# Patient Record
Sex: Female | Born: 1945 | Race: White | Hispanic: No | Marital: Married | Smoking: Never smoker
Health system: Southern US, Community
[De-identification: ages and names within clinical notes are randomized; demographics above are authoritative.]

## PROBLEM LIST (undated history)

## (undated) DIAGNOSIS — T7840XA Allergy, unspecified, initial encounter: Secondary | ICD-10-CM

## (undated) DIAGNOSIS — C259 Malignant neoplasm of pancreas, unspecified: Secondary | ICD-10-CM

## (undated) DIAGNOSIS — Z9221 Personal history of antineoplastic chemotherapy: Secondary | ICD-10-CM

## (undated) DIAGNOSIS — F32A Depression, unspecified: Secondary | ICD-10-CM

## (undated) DIAGNOSIS — N6009 Solitary cyst of unspecified breast: Secondary | ICD-10-CM

## (undated) DIAGNOSIS — K432 Incisional hernia without obstruction or gangrene: Secondary | ICD-10-CM

## (undated) DIAGNOSIS — E039 Hypothyroidism, unspecified: Secondary | ICD-10-CM

## (undated) DIAGNOSIS — Z5189 Encounter for other specified aftercare: Secondary | ICD-10-CM

## (undated) DIAGNOSIS — F329 Major depressive disorder, single episode, unspecified: Secondary | ICD-10-CM

## (undated) DIAGNOSIS — E079 Disorder of thyroid, unspecified: Secondary | ICD-10-CM

## (undated) DIAGNOSIS — N19 Unspecified kidney failure: Secondary | ICD-10-CM

## (undated) DIAGNOSIS — N281 Cyst of kidney, acquired: Secondary | ICD-10-CM

## (undated) DIAGNOSIS — D3A8 Other benign neuroendocrine tumors: Secondary | ICD-10-CM

## (undated) DIAGNOSIS — I10 Essential (primary) hypertension: Secondary | ICD-10-CM

## (undated) DIAGNOSIS — R011 Cardiac murmur, unspecified: Secondary | ICD-10-CM

## (undated) DIAGNOSIS — Z9889 Other specified postprocedural states: Secondary | ICD-10-CM

## (undated) DIAGNOSIS — D649 Anemia, unspecified: Secondary | ICD-10-CM

## (undated) DIAGNOSIS — C499 Malignant neoplasm of connective and soft tissue, unspecified: Secondary | ICD-10-CM

## (undated) DIAGNOSIS — N200 Calculus of kidney: Secondary | ICD-10-CM

## (undated) DIAGNOSIS — K469 Unspecified abdominal hernia without obstruction or gangrene: Secondary | ICD-10-CM

## (undated) DIAGNOSIS — R112 Nausea with vomiting, unspecified: Secondary | ICD-10-CM

## (undated) DIAGNOSIS — C50919 Malignant neoplasm of unspecified site of unspecified female breast: Secondary | ICD-10-CM

## (undated) DIAGNOSIS — N189 Chronic kidney disease, unspecified: Secondary | ICD-10-CM

## (undated) HISTORY — DX: Essential (primary) hypertension: I10

## (undated) HISTORY — DX: Chronic kidney disease, unspecified: N18.9

## (undated) HISTORY — PX: URETHRAL STRICTURE DILATATION: SHX477

## (undated) HISTORY — DX: Allergy, unspecified, initial encounter: T78.40XA

## (undated) HISTORY — DX: Encounter for other specified aftercare: Z51.89

## (undated) HISTORY — DX: Calculus of kidney: N20.0

## (undated) HISTORY — PX: BREAST SURGERY: SHX581

## (undated) HISTORY — PX: OTHER SURGICAL HISTORY: SHX169

## (undated) HISTORY — DX: Cardiac murmur, unspecified: R01.1

## (undated) HISTORY — PX: TOTAL KNEE ARTHROPLASTY: SHX125

## (undated) HISTORY — PX: ABDOMINAL HYSTERECTOMY: SHX81

## (undated) HISTORY — DX: Disorder of thyroid, unspecified: E07.9

## (undated) HISTORY — PX: CHOLECYSTECTOMY: SHX55

---

## 1998-03-03 ENCOUNTER — Ambulatory Visit (HOSPITAL_COMMUNITY): Admission: RE | Admit: 1998-03-03 | Discharge: 1998-03-03 | Payer: Self-pay | Admitting: Obstetrics and Gynecology

## 1998-09-05 HISTORY — PX: OTHER SURGICAL HISTORY: SHX169

## 1998-09-24 ENCOUNTER — Ambulatory Visit (HOSPITAL_COMMUNITY): Admission: RE | Admit: 1998-09-24 | Discharge: 1998-09-24 | Payer: Self-pay

## 1998-11-26 ENCOUNTER — Encounter: Payer: Self-pay | Admitting: Obstetrics and Gynecology

## 1998-11-26 ENCOUNTER — Ambulatory Visit (HOSPITAL_COMMUNITY): Admission: RE | Admit: 1998-11-26 | Discharge: 1998-11-26 | Payer: Self-pay | Admitting: Obstetrics and Gynecology

## 1999-01-13 ENCOUNTER — Ambulatory Visit (HOSPITAL_COMMUNITY): Admission: RE | Admit: 1999-01-13 | Discharge: 1999-01-13 | Payer: Self-pay | Admitting: *Deleted

## 1999-03-15 ENCOUNTER — Encounter: Payer: Self-pay | Admitting: *Deleted

## 1999-03-15 ENCOUNTER — Ambulatory Visit (HOSPITAL_COMMUNITY): Admission: RE | Admit: 1999-03-15 | Discharge: 1999-03-15 | Payer: Self-pay | Admitting: *Deleted

## 1999-07-08 ENCOUNTER — Inpatient Hospital Stay (HOSPITAL_COMMUNITY): Admission: RE | Admit: 1999-07-08 | Discharge: 1999-07-10 | Payer: Self-pay | Admitting: Obstetrics and Gynecology

## 1999-07-08 ENCOUNTER — Encounter (INDEPENDENT_AMBULATORY_CARE_PROVIDER_SITE_OTHER): Payer: Self-pay | Admitting: Specialist

## 1999-12-08 ENCOUNTER — Ambulatory Visit (HOSPITAL_COMMUNITY): Admission: RE | Admit: 1999-12-08 | Discharge: 1999-12-08 | Payer: Self-pay | Admitting: Obstetrics and Gynecology

## 1999-12-08 ENCOUNTER — Encounter: Payer: Self-pay | Admitting: Obstetrics and Gynecology

## 2000-04-04 ENCOUNTER — Encounter: Admission: RE | Admit: 2000-04-04 | Discharge: 2000-07-03 | Payer: Self-pay | Admitting: Endocrinology

## 2000-07-05 ENCOUNTER — Ambulatory Visit (HOSPITAL_COMMUNITY): Admission: RE | Admit: 2000-07-05 | Discharge: 2000-07-05 | Payer: Self-pay | Admitting: Endocrinology

## 2000-07-05 ENCOUNTER — Encounter: Payer: Self-pay | Admitting: Endocrinology

## 2000-12-25 ENCOUNTER — Ambulatory Visit (HOSPITAL_COMMUNITY): Admission: RE | Admit: 2000-12-25 | Discharge: 2000-12-25 | Payer: Self-pay | Admitting: Obstetrics and Gynecology

## 2000-12-25 ENCOUNTER — Encounter: Payer: Self-pay | Admitting: Obstetrics and Gynecology

## 2001-06-22 ENCOUNTER — Ambulatory Visit (HOSPITAL_COMMUNITY): Admission: RE | Admit: 2001-06-22 | Discharge: 2001-06-22 | Payer: Self-pay | Admitting: Endocrinology

## 2001-06-22 ENCOUNTER — Encounter: Payer: Self-pay | Admitting: Endocrinology

## 2001-07-30 ENCOUNTER — Encounter (INDEPENDENT_AMBULATORY_CARE_PROVIDER_SITE_OTHER): Payer: Self-pay | Admitting: *Deleted

## 2001-07-30 ENCOUNTER — Ambulatory Visit (HOSPITAL_COMMUNITY): Admission: RE | Admit: 2001-07-30 | Discharge: 2001-07-31 | Payer: Self-pay | Admitting: *Deleted

## 2001-07-30 HISTORY — PX: THYROIDECTOMY: SHX17

## 2001-09-11 ENCOUNTER — Other Ambulatory Visit: Admission: RE | Admit: 2001-09-11 | Discharge: 2001-09-11 | Payer: Self-pay | Admitting: Obstetrics and Gynecology

## 2001-12-27 ENCOUNTER — Encounter: Payer: Self-pay | Admitting: Obstetrics and Gynecology

## 2001-12-27 ENCOUNTER — Ambulatory Visit (HOSPITAL_COMMUNITY): Admission: RE | Admit: 2001-12-27 | Discharge: 2001-12-27 | Payer: Self-pay | Admitting: Obstetrics and Gynecology

## 2002-01-03 DIAGNOSIS — E079 Disorder of thyroid, unspecified: Secondary | ICD-10-CM

## 2002-01-03 HISTORY — DX: Disorder of thyroid, unspecified: E07.9

## 2002-12-31 ENCOUNTER — Encounter: Payer: Self-pay | Admitting: Obstetrics and Gynecology

## 2002-12-31 ENCOUNTER — Ambulatory Visit (HOSPITAL_COMMUNITY): Admission: RE | Admit: 2002-12-31 | Discharge: 2002-12-31 | Payer: Self-pay | Admitting: Obstetrics and Gynecology

## 2003-05-09 ENCOUNTER — Encounter: Admission: RE | Admit: 2003-05-09 | Discharge: 2003-05-27 | Payer: Self-pay | Admitting: Endocrinology

## 2003-09-06 HISTORY — PX: OTHER SURGICAL HISTORY: SHX169

## 2004-01-02 ENCOUNTER — Ambulatory Visit (HOSPITAL_COMMUNITY): Admission: RE | Admit: 2004-01-02 | Discharge: 2004-01-02 | Payer: Self-pay | Admitting: Obstetrics and Gynecology

## 2004-08-18 ENCOUNTER — Encounter: Admission: RE | Admit: 2004-08-18 | Discharge: 2004-09-20 | Payer: Self-pay | Admitting: Orthopaedic Surgery

## 2005-01-03 ENCOUNTER — Ambulatory Visit (HOSPITAL_COMMUNITY): Admission: RE | Admit: 2005-01-03 | Discharge: 2005-01-03 | Payer: Self-pay | Admitting: Obstetrics and Gynecology

## 2005-02-18 ENCOUNTER — Ambulatory Visit (HOSPITAL_COMMUNITY): Admission: RE | Admit: 2005-02-18 | Discharge: 2005-02-18 | Payer: Self-pay | Admitting: *Deleted

## 2005-02-18 ENCOUNTER — Encounter (INDEPENDENT_AMBULATORY_CARE_PROVIDER_SITE_OTHER): Payer: Self-pay | Admitting: *Deleted

## 2005-11-23 ENCOUNTER — Ambulatory Visit (HOSPITAL_COMMUNITY): Admission: RE | Admit: 2005-11-23 | Discharge: 2005-11-23 | Payer: Self-pay | Admitting: Obstetrics and Gynecology

## 2005-12-02 ENCOUNTER — Encounter: Admission: RE | Admit: 2005-12-02 | Discharge: 2005-12-02 | Payer: Self-pay | Admitting: Endocrinology

## 2006-11-27 ENCOUNTER — Ambulatory Visit (HOSPITAL_COMMUNITY): Admission: RE | Admit: 2006-11-27 | Discharge: 2006-11-27 | Payer: Self-pay | Admitting: Obstetrics and Gynecology

## 2007-11-28 ENCOUNTER — Ambulatory Visit (HOSPITAL_COMMUNITY): Admission: RE | Admit: 2007-11-28 | Discharge: 2007-11-28 | Payer: Self-pay | Admitting: Obstetrics and Gynecology

## 2008-12-16 ENCOUNTER — Ambulatory Visit (HOSPITAL_COMMUNITY): Admission: RE | Admit: 2008-12-16 | Discharge: 2008-12-16 | Payer: Self-pay | Admitting: Obstetrics and Gynecology

## 2009-05-03 ENCOUNTER — Emergency Department (HOSPITAL_BASED_OUTPATIENT_CLINIC_OR_DEPARTMENT_OTHER): Admission: EM | Admit: 2009-05-03 | Discharge: 2009-05-03 | Payer: Self-pay | Admitting: Emergency Medicine

## 2009-05-03 ENCOUNTER — Ambulatory Visit: Payer: Self-pay | Admitting: Interventional Radiology

## 2009-06-24 ENCOUNTER — Encounter: Admission: RE | Admit: 2009-06-24 | Discharge: 2009-07-24 | Payer: Self-pay | Admitting: Endocrinology

## 2009-08-07 ENCOUNTER — Emergency Department (HOSPITAL_BASED_OUTPATIENT_CLINIC_OR_DEPARTMENT_OTHER): Admission: EM | Admit: 2009-08-07 | Discharge: 2009-08-07 | Payer: Self-pay | Admitting: Emergency Medicine

## 2009-08-07 ENCOUNTER — Ambulatory Visit: Payer: Self-pay | Admitting: Diagnostic Radiology

## 2009-12-29 ENCOUNTER — Ambulatory Visit (HOSPITAL_COMMUNITY): Admission: RE | Admit: 2009-12-29 | Discharge: 2009-12-29 | Payer: Self-pay | Admitting: Obstetrics and Gynecology

## 2010-09-27 ENCOUNTER — Other Ambulatory Visit: Payer: Self-pay | Admitting: Dermatology

## 2010-12-09 ENCOUNTER — Other Ambulatory Visit (HOSPITAL_COMMUNITY): Payer: Self-pay | Admitting: Obstetrics and Gynecology

## 2010-12-09 DIAGNOSIS — Z1231 Encounter for screening mammogram for malignant neoplasm of breast: Secondary | ICD-10-CM

## 2011-01-11 ENCOUNTER — Ambulatory Visit (HOSPITAL_COMMUNITY)
Admission: RE | Admit: 2011-01-11 | Discharge: 2011-01-11 | Disposition: A | Discharge status: Home / Self Care | Payer: Medicare Other | Source: Ambulatory Visit | Attending: Obstetrics and Gynecology | Admitting: Obstetrics and Gynecology

## 2011-01-11 DIAGNOSIS — Z1231 Encounter for screening mammogram for malignant neoplasm of breast: Secondary | ICD-10-CM | POA: Insufficient documentation

## 2011-01-21 NOTE — Op Note (Signed)
NAME:  Kristi Fuentes, Kristi Fuentes NO.:  [id number]   MEDICAL RECORD NO.:  [id number]          PATIENT TYPE:  AMB   LOCATION:  ENDO                         FACILITY:  [facility]   PHYSICIAN:  Georgiana Spinner, M.D.    DATE OF BIRTH:  [birth year]   DATE OF PROCEDURE:  02/18/2005  DATE OF DISCHARGE:                                 OPERATIVE REPORT   PROCEDURE:  Colonoscopy.   INDICATIONS:  Colon polyps.   ANESTHESIA:  Demerol 70, Versed 7 mg.   PROCEDURE:  With the patient mildly sedated in the left lateral decubitus  position, the Olympus videoscopic colonoscope was inserted in the rectum and  passed under direct vision to the cecum, identified by ileocecal valve and  appendiceal orifice, both which were photographed.  From this point, the  colonoscope was slowly withdrawn, taking circumferential views of the  colonic mucosa, stopping only in the rectum which showed a small polyp that  was removed using hot biopsy forceps technique, setting of 20-20 blended  current.  It was then placed in retroflexion to view the anal canal from  above.  The endoscope was straightened and withdrawn.  The patient's vital  signs, pulse oximeter remained stable.  The patient tolerated procedure well  without apparent complications.   FINDINGS:  Polyp of rectum, removed. Await biopsy report.  The patient will  call me for results and follow-up with me as an outpatient.       GMO/MEDQ  D:  02/18/2005  T:  02/18/2005  Job:  [id number]

## 2011-04-05 ENCOUNTER — Encounter (INDEPENDENT_AMBULATORY_CARE_PROVIDER_SITE_OTHER): Payer: Self-pay | Admitting: Surgery

## 2011-04-11 ENCOUNTER — Encounter (INDEPENDENT_AMBULATORY_CARE_PROVIDER_SITE_OTHER): Payer: Self-pay | Admitting: Surgery

## 2011-04-11 ENCOUNTER — Ambulatory Visit (INDEPENDENT_AMBULATORY_CARE_PROVIDER_SITE_OTHER): Payer: Medicare Other | Admitting: Surgery

## 2011-04-11 VITALS — BP 164/84 | HR 64 | Temp 97.3°F | Ht 64.0 in | Wt 165.8 lb

## 2011-04-11 DIAGNOSIS — Z85831 Personal history of malignant neoplasm of soft tissue: Secondary | ICD-10-CM

## 2011-04-11 DIAGNOSIS — R2230 Localized swelling, mass and lump, unspecified upper limb: Secondary | ICD-10-CM

## 2011-04-11 DIAGNOSIS — R229 Localized swelling, mass and lump, unspecified: Secondary | ICD-10-CM

## 2011-04-11 DIAGNOSIS — Z8589 Personal history of malignant neoplasm of other organs and systems: Secondary | ICD-10-CM

## 2011-04-11 NOTE — Progress Notes (Signed)
Kristi Fuentes is a 65 y.o. female.    Chief Complaint  Patient presents with  . Other    skin lesion    HPI HPI This is a pleasant 65 year old female referred for evaluation of a left forearm mass. 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 She has noticed this mass for several months. It is small and mobile and causes minimal discomfort other than from pressure symptoms. She has no distal symptoms in her left hand. She is otherwise without complaints.  Past Medical History  Diagnosis Date  . Diabetes mellitus   . Hyperlipidemia   . Hypertension   . Chronic kidney disease   . Kidney stones   . Allergy     Past Surgical History  Procedure Date  . Buttock sarcoma   . Thyroidectomy   . Abdominal hysterectomy   . Total knee arthroplasty     left    Family History  Problem Relation Age of Onset  . Diabetes Father   . Kidney disease Father   . Cancer Father     colon    Social History History  Substance Use Topics  . Smoking status: Never Smoker   . Smokeless tobacco: Not on file  . Alcohol Use: No    Allergies  Allergen Reactions  . Altace     Current Outpatient Prescriptions  Medication Sig Dispense Refill  . amLODipine (NORVASC) 5 MG tablet Take 5 mg by mouth daily.        . fish oil-omega-3 fatty acids 1000 MG capsule Take 2 g by mouth daily.        Marland Kitchen glimepiride (AMARYL) 2 MG tablet Take 2 mg by mouth daily before breakfast. 1/2       . lamoTRIgine (LAMICTAL) 200 MG tablet Take 200 mg by mouth daily.        Marland Kitchen levothyroxine (SYNTHROID, LEVOTHROID) 150 MCG tablet Take 150 mcg by mouth daily.        Marland Kitchen loratadine (CLARITIN) 10 MG tablet Take 10 mg by mouth daily.        . pravastatin (PRAVACHOL) 80 MG tablet Take 80 mg by mouth daily.        Marland Kitchen aspirin 81 MG tablet Take 81 mg by mouth daily.        Marland Kitchen losartan (COZAAR) 50 MG tablet Take 50 mg by mouth daily.        . metFORMIN (GLUCOPHAGE) 500 MG tablet Take 500 mg by mouth  2 (two) times daily with a meal.        . pioglitazone (ACTOS) 45 MG tablet Take 45 mg by mouth daily.        . sitaGLIPtin (JANUVIA) 100 MG tablet Take 100 mg by mouth daily.        Marland Kitchen zolpidem (AMBIEN CR) 12.5 MG CR tablet Take 10 mg by mouth at bedtime as needed.          Review of Systems Review of Systems  Constitutional: Negative.   HENT: Negative.   Eyes: Negative.   Respiratory: Negative.   Cardiovascular: Negative.   Gastrointestinal: Negative.   Genitourinary: Negative.   Musculoskeletal: Negative.   Skin: Negative.   Neurological: Negative.   Endo/Heme/Allergies: Negative.   Psychiatric/Behavioral: Negative.     Physical Exam Physical Exam  Constitutional: She is oriented to person, place, and time. She appears well-developed and well-nourished. No distress.  HENT:  Head: Normocephalic and atraumatic.  Right Ear: External  ear normal.  Left Ear: External ear normal.  Nose: Nose normal.  Mouth/Throat: Oropharynx is clear and moist.  Eyes: Conjunctivae are normal. Pupils are equal, round, and reactive to light. No scleral icterus.  Neck: Normal range of motion. Neck supple. No tracheal deviation present.  Cardiovascular: Normal rate, regular rhythm, normal heart sounds and intact distal pulses.   No murmur heard. Respiratory: Effort normal and breath sounds normal. No respiratory distress.  Lymphadenopathy:    She has no axillary adenopathy.       Left axillary: No lateral adenopathy present. Neurological: She is alert and oriented to person, place, and time.  Skin: Skin is warm and dry. No erythema.  Psychiatric: Her behavior is normal. Judgment normal.   On evaluation of her left arm, there is a 1.5 cm mobile mass which feels subcutaneous on the posterior forearm. There are no skin changes. Distal pulses are intact. Range of motion and neurovascular exam is intact to the left hand.  Blood pressure 164/84, pulse 64, temperature 97.3 F (36.3 C), temperature source  Temporal, height 5\' 4"  (1.626 m), weight 165 lb 12.8 oz (75.206 kg).  Assessment/Plan  This is a patient with a left forearm mass of uncertain etiology. Given her previous history of sarcoma removal of this is recommended for histologic evaluation. I discussed the risk of surgery with her and her husband. These risks include but are not limited to bleeding, infection, need for further surgery, recurrence, injury to surrounding structures, etc. Surgery will be scheduled Aaryav Hopfensperger A 04/11/2011, 3:41 PM

## 2011-04-19 ENCOUNTER — Encounter (HOSPITAL_COMMUNITY)
Admission: RE | Admit: 2011-04-19 | Discharge: 2011-04-19 | Disposition: A | Discharge status: Home / Self Care | Payer: Medicare Other | Source: Ambulatory Visit | Attending: Surgery | Admitting: Surgery

## 2011-04-19 LAB — CBC
HCT: 38.7 % (ref 36.0–46.0)
Hemoglobin: 13.2 g/dL (ref 12.0–15.0)
MCH: 31 pg (ref 26.0–34.0)
MCHC: 34.1 g/dL (ref 30.0–36.0)
Platelets: 216 10*3/uL (ref 150–400)
RDW: 13.3 % (ref 11.5–15.5)
WBC: 6.6 10*3/uL (ref 4.0–10.5)

## 2011-04-19 LAB — BASIC METABOLIC PANEL
CO2: 25 mEq/L (ref 19–32)
Glucose, Bld: 130 mg/dL — ABNORMAL HIGH (ref 70–99)

## 2011-04-20 ENCOUNTER — Ambulatory Visit (HOSPITAL_COMMUNITY)
Admission: RE | Admit: 2011-04-20 | Discharge: 2011-04-20 | Disposition: A | Discharge status: Home / Self Care | Payer: Medicare Other | Source: Ambulatory Visit | Attending: Surgery | Admitting: Surgery

## 2011-04-20 ENCOUNTER — Other Ambulatory Visit (INDEPENDENT_AMBULATORY_CARE_PROVIDER_SITE_OTHER): Payer: Self-pay | Admitting: Surgery

## 2011-04-20 DIAGNOSIS — R2230 Localized swelling, mass and lump, unspecified upper limb: Secondary | ICD-10-CM

## 2011-04-20 DIAGNOSIS — D1739 Benign lipomatous neoplasm of skin and subcutaneous tissue of other sites: Secondary | ICD-10-CM

## 2011-04-20 DIAGNOSIS — M799 Soft tissue disorder, unspecified: Secondary | ICD-10-CM | POA: Insufficient documentation

## 2011-04-20 DIAGNOSIS — Z01812 Encounter for preprocedural laboratory examination: Secondary | ICD-10-CM | POA: Insufficient documentation

## 2011-04-20 DIAGNOSIS — Z01810 Encounter for preprocedural cardiovascular examination: Secondary | ICD-10-CM | POA: Insufficient documentation

## 2011-04-20 HISTORY — PX: LIPOMA EXCISION: SHX5283

## 2011-04-20 LAB — GLUCOSE, CAPILLARY
Glucose-Capillary: 149 mg/dL — ABNORMAL HIGH (ref 70–99)
Glucose-Capillary: 148 mg/dL — ABNORMAL HIGH (ref 70–99)

## 2011-04-24 NOTE — Op Note (Addendum)
  NAME:  Kristi Fuentes, ART NO.:  [id number]  MEDICAL RECORD NO.:  [id number]  LOCATION:  XRAY                         FACILITY:  [facility]  PHYSICIAN:  Abigail Miyamoto, M.D. DATE OF BIRTH:  [birth year]  DATE OF PROCEDURE:  04/20/2011 DATE OF DISCHARGE:                              OPERATIVE REPORT   PREOPERATIVE DIAGNOSIS:  A 1 cm left forearm mass.  POSTOPERATIVE DIAGNOSIS:  A 1 cm left forearm mass.  PROCEDURE:  Excision of 1-cm left subcutaneous forearm mass.  SURGEON:  Abigail Miyamoto, MD.  ANESTHESIA:  1% lidocaine and monitored anesthesia care.  ESTIMATED BLOOD LOSS:  Minimal.  INDICATIONS:  This is a 65 year old female who presents with a 1-cm mass on her left forearm.  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 Therefore, decision made to proceed with removal of this mass for histologic evaluation.  PROCEDURE IN DETAIL:  The patient was brought to the operating room, identified as Kristi Fuentes.  She was placed supine on the operating room table and anesthesia was induced.  Her left forearm was then prepped and draped in usual sterile fashion.  The skin over the palpable mass on the forearm was anesthetized with 1% lidocaine.  I made a small incision with scalpel and took this down to subcutaneous tissue to the area of the mass.  The mass was then excised with electrocautery.  It appeared consistent with lipoma.  It was sent to Pathology for evaluation.  I then closed subcutaneous tissue with single interrupted 3-0 Vicryl suture and closed the skin with running 4-0 Monocryl.  Steri-Strips, gauze, and tape were then applied.  The patient tolerated the procedure well.  All counts were correct at the end of procedure.  The patient was then taken in a stable condition from the operating room to the recovery room.     Abigail Miyamoto, M.D.     DB/MEDQ  D:  04/20/2011  T:  04/20/2011  Job:  [id number]  Electronically Signed by Abigail Miyamoto M.D. on  05/13/2011 12:36:41 PM

## 2011-04-29 ENCOUNTER — Encounter (INDEPENDENT_AMBULATORY_CARE_PROVIDER_SITE_OTHER): Payer: Self-pay | Admitting: Surgery

## 2011-05-03 ENCOUNTER — Encounter (INDEPENDENT_AMBULATORY_CARE_PROVIDER_SITE_OTHER): Payer: Self-pay | Admitting: Surgery

## 2011-05-03 ENCOUNTER — Ambulatory Visit (INDEPENDENT_AMBULATORY_CARE_PROVIDER_SITE_OTHER): Payer: Medicare Other | Admitting: Surgery

## 2011-05-03 DIAGNOSIS — Z09 Encounter for follow-up examination after completed treatment for conditions other than malignant neoplasm: Secondary | ICD-10-CM

## 2011-05-03 NOTE — Progress Notes (Signed)
Subjective:     Patient ID: Kristi Fuentes, female   DOB: 1945/12/09, 65 y.o.   MRN: [id number]  HPI She is here for her first postoperative visit status post removal of a small mass on the left arm. She is doing well and has no complaints.  Review of Systems     Objective:   Physical Exam On exam, the incision is healing well. The final pathology showed this to be a lipoma with no evidence of malignancy or sarcoma.    Assessment:     Patient status post removal of arm lipoma    Plan:     She will followup as needed.

## 2011-09-06 DIAGNOSIS — D3A8 Other benign neuroendocrine tumors: Secondary | ICD-10-CM

## 2011-09-06 HISTORY — DX: Other benign neuroendocrine tumors: D3A.8

## 2011-10-16 ENCOUNTER — Emergency Department (HOSPITAL_BASED_OUTPATIENT_CLINIC_OR_DEPARTMENT_OTHER)
Admission: EM | Admit: 2011-10-16 | Discharge: 2011-10-16 | Disposition: A | Discharge status: Home / Self Care | Payer: Medicare Other | Attending: Emergency Medicine | Admitting: Emergency Medicine

## 2011-10-16 ENCOUNTER — Encounter (HOSPITAL_BASED_OUTPATIENT_CLINIC_OR_DEPARTMENT_OTHER): Payer: Self-pay | Admitting: *Deleted

## 2011-10-16 ENCOUNTER — Emergency Department (INDEPENDENT_AMBULATORY_CARE_PROVIDER_SITE_OTHER): Payer: Medicare Other

## 2011-10-16 DIAGNOSIS — I129 Hypertensive chronic kidney disease with stage 1 through stage 4 chronic kidney disease, or unspecified chronic kidney disease: Secondary | ICD-10-CM | POA: Insufficient documentation

## 2011-10-16 DIAGNOSIS — G8918 Other acute postprocedural pain: Secondary | ICD-10-CM | POA: Insufficient documentation

## 2011-10-16 DIAGNOSIS — R112 Nausea with vomiting, unspecified: Secondary | ICD-10-CM

## 2011-10-16 DIAGNOSIS — R918 Other nonspecific abnormal finding of lung field: Secondary | ICD-10-CM

## 2011-10-16 DIAGNOSIS — E785 Hyperlipidemia, unspecified: Secondary | ICD-10-CM | POA: Insufficient documentation

## 2011-10-16 DIAGNOSIS — Z9889 Other specified postprocedural states: Secondary | ICD-10-CM

## 2011-10-16 DIAGNOSIS — N189 Chronic kidney disease, unspecified: Secondary | ICD-10-CM | POA: Insufficient documentation

## 2011-10-16 DIAGNOSIS — Z79899 Other long term (current) drug therapy: Secondary | ICD-10-CM | POA: Insufficient documentation

## 2011-10-16 DIAGNOSIS — E119 Type 2 diabetes mellitus without complications: Secondary | ICD-10-CM | POA: Insufficient documentation

## 2011-10-16 HISTORY — DX: Malignant neoplasm of pancreas, unspecified: C25.9

## 2011-10-16 HISTORY — DX: Other benign neuroendocrine tumors: D3A.8

## 2011-10-16 LAB — COMPREHENSIVE METABOLIC PANEL
AST: 53 U/L — ABNORMAL HIGH (ref 0–37)
Albumin: 3.4 g/dL — ABNORMAL LOW (ref 3.5–5.2)
Calcium: 7.7 mg/dL — ABNORMAL LOW (ref 8.4–10.5)
Creatinine, Ser: 1.3 mg/dL — ABNORMAL HIGH (ref 0.50–1.10)
GFR calc non Af Amer: 41 mL/min — ABNORMAL LOW (ref >90)
Total Protein: 6.8 g/dL (ref 6.0–8.3)

## 2011-10-16 LAB — CBC
HCT: 35.6 % — ABNORMAL LOW (ref 36.0–46.0)
Hemoglobin: 12 g/dL (ref 12.0–15.0)
MCH: 31 pg (ref 26.0–34.0)
MCV: 92 fL (ref 78.0–100.0)
RBC: 3.87 MIL/uL (ref 3.87–5.11)

## 2011-10-16 LAB — DIFFERENTIAL
Basophils Relative: 0 % (ref 0–1)
Eosinophils Relative: 1 % (ref 0–5)
Monocytes Absolute: 1.1 10*3/uL — ABNORMAL HIGH (ref 0.1–1.0)
Neutrophils Relative %: 85 % — ABNORMAL HIGH (ref 43–77)
Smear Review: INCREASED

## 2011-10-16 LAB — GLUCOSE, CAPILLARY: Glucose-Capillary: 273 mg/dL — ABNORMAL HIGH (ref 70–99)

## 2011-10-16 MED ORDER — FENTANYL CITRATE 0.05 MG/ML IJ SOLN: 50.0000 ug | Freq: Once | INTRAMUSCULAR | Status: DC | PRN

## 2011-10-16 MED ORDER — PROMETHAZINE HCL 25 MG RE SUPP: 25.0000 mg | Freq: Four times a day (QID) | RECTAL | Status: AC | PRN

## 2011-10-16 MED ORDER — PROMETHAZINE HCL 25 MG PO TABS: 25.0000 mg | ORAL_TABLET | Freq: Four times a day (QID) | ORAL | Status: AC | PRN

## 2011-10-16 MED ORDER — OXYCODONE-ACETAMINOPHEN 5-325 MG PO TABS: 1.0000 | ORAL_TABLET | Freq: Four times a day (QID) | ORAL | Status: AC | PRN

## 2011-10-16 MED ORDER — PROMETHAZINE HCL 25 MG/ML IJ SOLN
12.5000 mg | Freq: Once | INTRAMUSCULAR | Status: AC | PRN
  Administered 2011-10-16: 06:00:00 via INTRAVENOUS
  Filled 2011-10-16: qty 1

## 2011-10-16 MED ORDER — ONDANSETRON HCL 4 MG/2ML IJ SOLN: 4.0000 mg | Freq: Once | INTRAMUSCULAR | Status: DC

## 2011-10-16 MED ORDER — ONDANSETRON HCL 4 MG/2ML IJ SOLN
4.0000 mg | Freq: Once | INTRAMUSCULAR | Status: AC
Start: 2011-10-16 — End: 2011-10-16
  Administered 2011-10-16: 4 mg via INTRAVENOUS
  Filled 2011-10-16: qty 2

## 2011-10-16 NOTE — ED Provider Notes (Signed)
History     CSN: [id number]  Arrival date & time 10/16/11  0205   First MD Initiated Contact with Patient 10/16/11 [phone]      Chief Complaint  Patient presents with  . Postoperative pain, nausea & vomiting     (Consider location/radiation/quality/duration/timing/severity/associated sxs/prior treatment) HPI This is a 66 year old white female recently diagnosed with pancreatic cancer. She underwent a subtotal pancreatectomy, cholecystectomy and splenectomy on January 31. This was performed at [facility]. She had been doing well postoperatively, able to eat small amounts of food and keep herself hydrated. She's been having regular bowel movements. Yesterday she ate an egg McMuffin at [facility] and thinks this may have been overdoing it. Yesterday evening she developed abdominal pain and fairly severe nausea. At about 11 PM she vomited 3 times, relieving the pain. She continues to be nauseated and does not wish to vomit again. Her pain at this time is not severe and she does not wish any analgesics at this time. She states her mouth is very dry.  Past Medical History  Diagnosis Date  . Diabetes mellitus   . Hyperlipidemia   . Hypertension   . Chronic kidney disease   . Kidney stones   . Allergy   . Pancreatic cancer   . Primary pancreatic neuroendocrine tumor     Past Surgical History  Procedure Date  . Buttock sarcoma   . Thyroidectomy   . Abdominal hysterectomy   . Total knee arthroplasty     left  . Lipoma excision 04/20/2011    left arm  . Cholecystectomy   . Spleenectomy     Family History  Problem Relation Age of Onset  . Diabetes Father   . Kidney disease Father   . Cancer Father     colon    History  Substance Use Topics  . Smoking status: Never Smoker   . Smokeless tobacco: Not on file  . Alcohol Use: No    OB History    Grav Para Term Preterm Abortions TAB SAB Ect Mult Living                  Review of Systems  All other systems reviewed  and are negative.    Allergies  Altace and Reglan  Home Medications   Current Outpatient Rx  Name Route Sig Dispense Refill  . ENOXAPARIN SODIUM 40 MG/0.4ML [place] SOLN Subcutaneous Inject into the skin daily.    . OXYCODONE HCL 5 MG PO CAPS Oral Take 5 mg by mouth every 4 (four) hours as needed.    Marland Kitchen PANTOPRAZOLE SODIUM 40 MG PO TBEC Oral Take 40 mg by mouth daily.    Marland Kitchen AMLODIPINE BESYLATE 5 MG PO TABS Oral Take 5 mg by mouth daily.      . ASPIRIN 81 MG PO TABS Oral Take 81 mg by mouth daily.      . ATENOLOL 25 MG PO TABS Oral Take 25 mg by mouth daily.      . OMEGA-3 FATTY ACIDS 1000 MG PO CAPS Oral Take 2 g by mouth daily.      Marland Kitchen GLIMEPIRIDE 2 MG PO TABS Oral Take 2 mg by mouth daily before breakfast. 1/2     . LAMOTRIGINE 200 MG PO TABS Oral Take 200 mg by mouth daily.     Marland Kitchen LEVOTHYROXINE SODIUM 150 MCG PO TABS Oral Take 150 mcg by mouth daily.      Marland Kitchen LORATADINE 10 MG PO TABS Oral Take 10 mg by  mouth daily.      Marland Kitchen PRAVASTATIN SODIUM 80 MG PO TABS Oral Take 80 mg by mouth daily.      Marland Kitchen ZOLPIDEM TARTRATE ER 12.5 MG PO TBCR Oral Take 10 mg by mouth at bedtime as needed.        BP 169/71  Pulse 73  Temp(Src) 97.7 F (36.5 C) (Oral)  Resp 20  SpO2 96%  Physical Exam General: Well-developed, well-nourished female in no acute distress; appearance consistent with age of record HENT: normocephalic, atraumatic; dry mucous membrane Eyes: Normal appearance Neck: supple Heart: regular rate and rhythm Lungs: clear to auscultation bilaterally Abdomen: soft; well healing midline surgical incision with staples still in place, no signs of infection; mild tenderness and induration surrounding the incision; bowel sounds present Extremities: No deformity; full range of motion; pulses normal Neurologic: Awake, alert and oriented; motor function intact in all extremities and symmetric; no facial droop Skin: Warm and dry Psychiatric: Anxious    ED Course  Procedures (including critical care  time)    MDM   Nursing notes and vitals signs, including pulse oximetry, reviewed.  Summary of this visit's results, reviewed by myself:  Labs:  Results for orders placed during the hospital encounter of 10/16/11  GLUCOSE, CAPILLARY      Component Value Range   Glucose-Capillary 273 (*) 70 - 99 (mg/dL)  COMPREHENSIVE METABOLIC PANEL      Component Value Range   Sodium 140  135 - 145 (mEq/L)   Potassium 4.4  3.5 - 5.1 (mEq/L)   Chloride 100  96 - 112 (mEq/L)   CO2 25  19 - 32 (mEq/L)   Glucose, Bld 279 (*) 70 - 99 (mg/dL)   BUN 23  6 - 23 (mg/dL)   Creatinine, Ser 2.13 (*) 0.50 - 1.10 (mg/dL)   Calcium 7.7 (*) 8.4 - 10.5 (mg/dL)   Total Protein 6.8  6.0 - 8.3 (g/dL)   Albumin 3.4 (*) 3.5 - 5.2 (g/dL)   AST 53 (*) 0 - 37 (U/L)   ALT 67 (*) 0 - 35 (U/L)   Alkaline Phosphatase 133 (*) 39 - 117 (U/L)   Total Bilirubin 0.2 (*) 0.3 - 1.2 (mg/dL)   GFR calc non Af Amer 41 (*) >90 (mL/min)   GFR calc Af Amer 48 (*) >90 (mL/min)  CBC      Component Value Range   WBC 15.6 (*) 4.0 - 10.5 (K/uL)   RBC 3.87  3.87 - 5.11 (MIL/uL)   Hemoglobin 12.0  12.0 - 15.0 (g/dL)   HCT 08.6 (*) 57.8 - 46.0 (%)   MCV 92.0  78.0 - 100.0 (fL)   MCH 31.0  26.0 - 34.0 (pg)   MCHC 33.7  30.0 - 36.0 (g/dL)   RDW 46.9  62.9 - 52.8 (%)   Platelets 767 (*) 150 - 400 (K/uL)  DIFFERENTIAL      Component Value Range   Neutrophils Relative 85 (*) 43 - 77 (%)   Lymphocytes Relative 7 (*) 12 - 46 (%)   Monocytes Relative 7  3 - 12 (%)   Eosinophils Relative 1  0 - 5 (%)   Basophils Relative 0  0 - 1 (%)   Neutro Abs 13.2 (*) 1.7 - 7.7 (K/uL)   Lymphs Abs 1.1  0.7 - 4.0 (K/uL)   Monocytes Absolute 1.1 (*) 0.1 - 1.0 (K/uL)   Eosinophils Absolute 0.2  0.0 - 0.7 (K/uL)   Basophils Absolute 0.0  0.0 - 0.1 (K/uL)  RBC Morphology POLYCHROMASIA PRESENT     Smear Review PLATELETS APPEAR INCREASED    LIPASE, BLOOD      Component Value Range   Lipase 21  11 - 59 (U/L)    Imaging Studies: Dg Abd Acute  W/chest  10/16/2011  *RADIOLOGY REPORT*  Clinical Data: Nausea and vomiting.  Pancreatic surgery 10 days ago.  ACUTE ABDOMEN SERIES (ABDOMEN 2 VIEW & CHEST 1 VIEW)  Comparison: None.  Findings: Elevation of the left hemidiaphragm.  Normal heart size and pulmonary vascularity.  Linear fibrosis or atelectasis in the left lung base.  Tortuous aorta.  Calcified granulomas in the mediastinum and left hilum.  No focal airspace consolidation in the lungs.  No blunting of costophrenic angles.  No pneumothorax.  The surgical clips along the midline abdomen.  Scattered gas and stool in the colon without small or large bowel distension.  No free intra-abdominal air.  No abnormal air fluid levels. Thoracolumbar scoliosis with degenerative change.  IMPRESSION: No evidence of active pulmonary disease.  Atelectasis or fibrosis in the left lung base.  Nonobstructive bowel gas pattern.  No free air.  Original Report Authenticated By: Marlon Pel, M.D.   5:49 AM Nausea improved after IV Zofran and Phenergan. Patient drinking fluids without emesis. States she is ready to go home.         Hanley Seamen, MD 10/16/11 [phone]

## 2011-10-16 NOTE — ED Notes (Signed)
.   Pt reports having increasing abdominal pain tonight, nausea, and vomiting. (Reports Pt had spleen, gallbladder and part of her pancreas removed on Jan 31)

## 2011-10-17 NOTE — ED Notes (Signed)
Pt's husband called stating that phenergan tablets that were e-prescribed to their pharmacy were not available for pickup when they arrived.  This RN called Psychologist, forensic on [address] and this prescription apparently did not get received by their pharmacy.  VO was given per discharge summary for Phenergan tablets 25mg  q6hrs prn for nausea quantity 30 no refills.  Pt's husband was called back and informed that this error had been corrected.  No further action needed.

## 2012-03-07 ENCOUNTER — Other Ambulatory Visit (HOSPITAL_COMMUNITY): Payer: Self-pay | Admitting: Obstetrics and Gynecology

## 2012-03-07 DIAGNOSIS — Z1231 Encounter for screening mammogram for malignant neoplasm of breast: Secondary | ICD-10-CM

## 2012-03-30 ENCOUNTER — Ambulatory Visit (HOSPITAL_COMMUNITY)
Admission: RE | Admit: 2012-03-30 | Discharge: 2012-03-30 | Disposition: A | Discharge status: Home / Self Care | Payer: Medicare Other | Source: Ambulatory Visit | Attending: Obstetrics and Gynecology | Admitting: Obstetrics and Gynecology

## 2012-03-30 DIAGNOSIS — Z1231 Encounter for screening mammogram for malignant neoplasm of breast: Secondary | ICD-10-CM | POA: Insufficient documentation

## 2012-04-04 ENCOUNTER — Other Ambulatory Visit: Payer: Self-pay | Admitting: Obstetrics and Gynecology

## 2012-04-04 DIAGNOSIS — R928 Other abnormal and inconclusive findings on diagnostic imaging of breast: Secondary | ICD-10-CM

## 2012-04-09 ENCOUNTER — Ambulatory Visit
Admission: RE | Admit: 2012-04-09 | Discharge: 2012-04-09 | Disposition: A | Discharge status: Home / Self Care | Payer: Medicare Other | Source: Ambulatory Visit | Attending: Obstetrics and Gynecology | Admitting: Obstetrics and Gynecology

## 2012-04-09 DIAGNOSIS — R928 Other abnormal and inconclusive findings on diagnostic imaging of breast: Secondary | ICD-10-CM

## 2013-02-13 ENCOUNTER — Other Ambulatory Visit (HOSPITAL_COMMUNITY): Payer: Self-pay | Admitting: Obstetrics and Gynecology

## 2013-02-13 DIAGNOSIS — Z1231 Encounter for screening mammogram for malignant neoplasm of breast: Secondary | ICD-10-CM

## 2013-04-16 ENCOUNTER — Ambulatory Visit (HOSPITAL_COMMUNITY)
Admission: RE | Admit: 2013-04-16 | Discharge: 2013-04-16 | Disposition: A | Discharge status: Home / Self Care | Payer: Medicare Other | Source: Ambulatory Visit | Attending: Obstetrics and Gynecology | Admitting: Obstetrics and Gynecology

## 2013-04-16 ENCOUNTER — Other Ambulatory Visit: Payer: Self-pay | Admitting: Obstetrics and Gynecology

## 2013-04-16 DIAGNOSIS — Z1231 Encounter for screening mammogram for malignant neoplasm of breast: Secondary | ICD-10-CM | POA: Insufficient documentation

## 2013-04-16 DIAGNOSIS — R928 Other abnormal and inconclusive findings on diagnostic imaging of breast: Secondary | ICD-10-CM

## 2013-05-02 ENCOUNTER — Ambulatory Visit
Admission: RE | Admit: 2013-05-02 | Discharge: 2013-05-02 | Disposition: A | Discharge status: Home / Self Care | Payer: Medicare Other | Source: Ambulatory Visit | Attending: Obstetrics and Gynecology | Admitting: Obstetrics and Gynecology

## 2013-05-02 ENCOUNTER — Other Ambulatory Visit: Payer: Self-pay | Admitting: Obstetrics and Gynecology

## 2013-05-02 DIAGNOSIS — R928 Other abnormal and inconclusive findings on diagnostic imaging of breast: Secondary | ICD-10-CM

## 2013-05-03 ENCOUNTER — Other Ambulatory Visit: Payer: Self-pay | Admitting: Obstetrics and Gynecology

## 2013-05-03 DIAGNOSIS — C50919 Malignant neoplasm of unspecified site of unspecified female breast: Secondary | ICD-10-CM

## 2013-05-09 ENCOUNTER — Encounter (INDEPENDENT_AMBULATORY_CARE_PROVIDER_SITE_OTHER): Payer: Self-pay | Admitting: Surgery

## 2013-05-09 ENCOUNTER — Ambulatory Visit (INDEPENDENT_AMBULATORY_CARE_PROVIDER_SITE_OTHER): Payer: No Typology Code available for payment source | Admitting: Surgery

## 2013-05-09 VITALS — BP 140/80 | HR 60 | Temp 98.4°F | Resp 14 | Ht 62.5 in | Wt 159.8 lb

## 2013-05-09 DIAGNOSIS — C50919 Malignant neoplasm of unspecified site of unspecified female breast: Secondary | ICD-10-CM

## 2013-05-09 DIAGNOSIS — C50911 Malignant neoplasm of unspecified site of right female breast: Secondary | ICD-10-CM | POA: Insufficient documentation

## 2013-05-09 NOTE — Progress Notes (Signed)
Patient ID: Kristi Fuentes, female   DOB: Jan 07, 1946, 67 y.o.   MRN: [id number]  Chief Complaint  Patient presents with  . New Evaluation    new br cancer in right breast    HPI Kristi Fuentes is a 67 y.o. female.   HPI This is a very pleasant patient that I have operated on in the past for a lipoma on her arm.  She now, unfortunately, has been found to have a right breast cancer. She was operated on in [place] for pancreatic cancer this past February. Regarding her breast, she has had no nipple discharge. She is otherwise without complaints today Past Medical History  Diagnosis Date  . Diabetes mellitus   . Hyperlipidemia   . Hypertension   . Chronic kidney disease   . Kidney stones   . Allergy   . Pancreatic cancer   . Primary pancreatic neuroendocrine tumor   . Blood transfusion without reported diagnosis   . Heart murmur   . Thyroid disease     Past Surgical History  Procedure Laterality Date  . Buttock sarcoma    . Thyroidectomy    . Abdominal hysterectomy    . Total knee arthroplasty      left  . Lipoma excision  04/20/2011    left arm  . Cholecystectomy    . Spleenectomy    . Breast surgery    . Urethral stricture dilatation  [id number]    Family History  Problem Relation Age of Onset  . Diabetes Father   . Kidney disease Father   . Cancer Father     colon  . Cancer Maternal Grandmother     breast    Social History History  Substance Use Topics  . Smoking status: Never Smoker   . Smokeless tobacco: Never Used  . Alcohol Use: No    Allergies  Allergen Reactions  . Metoclopramide Hcl     Tardive dyskinesia-type symptoms  . Ramipril     Cough     Current Outpatient Prescriptions  Medication Sig Dispense Refill  . amLODipine (NORVASC) 5 MG tablet Take 5 mg by mouth daily.        Marland Kitchen atenolol (TENORMIN) 25 MG tablet Take 25 mg by mouth daily.        . insulin aspart (NOVOLOG) 100 UNIT/ML injection Inject into the skin 3 (three) times  daily with meals.      . insulin glargine (LANTUS) 100 UNIT/ML injection Inject into the skin at bedtime.      . lamoTRIgine (LAMICTAL) 200 MG tablet Take 200 mg by mouth daily.       Marland Kitchen levothyroxine (SYNTHROID, LEVOTHROID) 150 MCG tablet Take 150 mcg by mouth daily.        Marland Kitchen loratadine (CLARITIN) 10 MG tablet Take 10 mg by mouth daily.        . pravastatin (PRAVACHOL) 80 MG tablet Take 80 mg by mouth daily.        Marland Kitchen zolpidem (AMBIEN CR) 12.5 MG CR tablet Take 10 mg by mouth at bedtime as needed.        Marland Kitchen aspirin 81 MG tablet Take 81 mg by mouth daily.        Marland Kitchen enoxaparin (LOVENOX) 40 MG/0.4ML SOLN Inject into the skin daily.      . fish oil-omega-3 fatty acids 1000 MG capsule Take 2 g by mouth daily.        Marland Kitchen glimepiride (AMARYL) 2 MG tablet Take 2 mg  by mouth daily before breakfast. 1/2       . oxycodone (OXY-IR) 5 MG capsule Take 5 mg by mouth every 4 (four) hours as needed.      . pantoprazole (PROTONIX) 40 MG tablet Take 40 mg by mouth daily.       No current facility-administered medications for this visit.    Review of Systems Review of Systems  Constitutional: Negative for fever, chills and unexpected weight change.  HENT: Negative for hearing loss, congestion, sore throat, trouble swallowing and voice change.   Eyes: Negative for visual disturbance.  Respiratory: Negative for cough and wheezing.   Cardiovascular: Negative for chest pain, palpitations and leg swelling.  Gastrointestinal: Negative for nausea, vomiting, abdominal pain, diarrhea, constipation, blood in stool, abdominal distention and anal bleeding.  Genitourinary: Negative for hematuria, vaginal bleeding and difficulty urinating.  Musculoskeletal: Negative for arthralgias.  Skin: Negative for rash and wound.  Neurological: Negative for seizures, syncope and headaches.  Hematological: Negative for adenopathy. Does not bruise/bleed easily.  Psychiatric/Behavioral: Negative for confusion.    Blood pressure 140/80,  pulse 60, temperature 98.4 F (36.9 C), temperature source Temporal, resp. rate 14, height 5' 2.5" (1.588 m), weight 159 lb 12.8 oz (72.485 kg).  Physical Exam Physical Exam  Constitutional: She is oriented to person, place, and time. She appears well-developed and well-nourished. No distress.  HENT:  Head: Normocephalic and atraumatic.  Right Ear: External ear normal.  Left Ear: External ear normal.  Nose: Nose normal.  Mouth/Throat: Oropharynx is clear and moist. No oropharyngeal exudate.  Eyes: Conjunctivae are normal. Pupils are equal, round, and reactive to light.  Neck: Normal range of motion. Neck supple. No tracheal deviation present.  Cardiovascular: Normal rate, regular rhythm, normal heart sounds and intact distal pulses.   No murmur heard. Pulmonary/Chest: Effort normal and breath sounds normal. No respiratory distress. She has no wheezes.  Abdominal: Soft. Bowel sounds are normal. She exhibits no distension. There is no tenderness.  Well-healed midline incision  Musculoskeletal: Normal range of motion. She exhibits no edema and no tenderness.  Lymphadenopathy:    She has no cervical adenopathy.  Neurological: She is alert and oriented to person, place, and time.  Skin: Skin is warm and dry. No rash noted. No erythema.  Psychiatric: Her behavior is normal. Judgment normal.  Breasts: There is ecchymosis from a biopsy of the right breast. There is mild fullness of the 12:00 position. There are no other masses in either breast There is no axillary adenopathy on either side Data Reviewed I have reviewed her pathology results a mammogram demonstrating the right breast cancer at the 12:00 position that is 1.5 cm in size. It is ER and PR positive. I have also reviewed her most recent laboratory data from [facility] in [place]. Her GFR is 25  Assessment    Right breast cancer     Plan    Unfortunately, she cannot undergo an MRI given her renal function. I  discussed conservative treatment with lumpectomy followed by radiation first mastectomy. She wishes to proceed with breast conservation. This would be a needle localized lumpectomy on the right side with right axillary sentinel lymph node biopsy. I discussed this with the patient and her husband in detail.  I discussed the risks of surgery which includes but not limited to bleeding, infection, injury to stranding structures, seroma formation, need for further surgery if the margins are positive, et Karie Soda. She understands and wishes to proceed. Surgery will be scheduled  Kristi Fuentes A 05/09/2013, 2:44 PM

## 2013-05-10 ENCOUNTER — Other Ambulatory Visit: Payer: Medicare Other

## 2013-05-21 ENCOUNTER — Encounter (HOSPITAL_COMMUNITY): Payer: Self-pay | Admitting: Pharmacy Technician

## 2013-05-23 ENCOUNTER — Encounter (HOSPITAL_COMMUNITY)
Admission: RE | Admit: 2013-05-23 | Discharge: 2013-05-23 | Disposition: A | Discharge status: Home / Self Care | Payer: Medicare Other | Source: Ambulatory Visit | Attending: Anesthesiology | Admitting: Anesthesiology

## 2013-05-23 ENCOUNTER — Encounter (HOSPITAL_COMMUNITY): Payer: Self-pay

## 2013-05-23 ENCOUNTER — Encounter (HOSPITAL_COMMUNITY)
Admission: RE | Admit: 2013-05-23 | Discharge: 2013-05-23 | Disposition: A | Discharge status: Home / Self Care | Payer: Medicare Other | Source: Ambulatory Visit | Attending: Orthopaedic Surgery | Admitting: Orthopaedic Surgery

## 2013-05-23 DIAGNOSIS — Z01810 Encounter for preprocedural cardiovascular examination: Secondary | ICD-10-CM | POA: Insufficient documentation

## 2013-05-23 DIAGNOSIS — Z01818 Encounter for other preprocedural examination: Secondary | ICD-10-CM | POA: Insufficient documentation

## 2013-05-23 DIAGNOSIS — Z01812 Encounter for preprocedural laboratory examination: Secondary | ICD-10-CM | POA: Insufficient documentation

## 2013-05-23 HISTORY — DX: Other specified postprocedural states: R11.2

## 2013-05-23 HISTORY — DX: Hypothyroidism, unspecified: E03.9

## 2013-05-23 HISTORY — DX: Solitary cyst of unspecified breast: N60.09

## 2013-05-23 HISTORY — DX: Incisional hernia without obstruction or gangrene: K43.2

## 2013-05-23 HISTORY — DX: Other specified postprocedural states: Z98.890

## 2013-05-23 HISTORY — DX: Anemia, unspecified: D64.9

## 2013-05-23 HISTORY — DX: Cyst of kidney, acquired: N28.1

## 2013-05-23 LAB — BASIC METABOLIC PANEL
BUN: 50 mg/dL — ABNORMAL HIGH (ref 6–23)
CO2: 24 mEq/L (ref 19–32)
Chloride: 99 mEq/L (ref 96–112)
GFR calc non Af Amer: 23 mL/min — ABNORMAL LOW (ref >90)
Glucose, Bld: 182 mg/dL — ABNORMAL HIGH (ref 70–99)
Potassium: 4 mEq/L (ref 3.5–5.1)
Sodium: 137 mEq/L (ref 135–145)

## 2013-05-23 LAB — CBC
HCT: 37.2 % (ref 36.0–46.0)
Hemoglobin: 12.2 g/dL (ref 12.0–15.0)
RBC: 3.9 MIL/uL (ref 3.87–5.11)

## 2013-05-23 NOTE — Progress Notes (Signed)
Spoke with NM Pt is scheduled for injections DOS.

## 2013-05-23 NOTE — Progress Notes (Signed)
Denies seeing a Cardiologist. PCP Dr Darci Needle Denies recent EKG, Chest Xray, Echo, stress test, and Card cath.

## 2013-05-23 NOTE — Pre-Procedure Instructions (Addendum)
JAKKI DOUGHTY  05/23/2013   Your procedure is scheduled on:  Sept 25 @1000   Report to [facility] at 0800AM.  Call this number if you have problems the morning of surgery: [phone]   Remember:   Do not eat food or drink liquids after midnight.   Take these medicines the morning of surgery with A SIP OF WATER: Norvasc (amlopinine), Atenolol (tenormin), Lamictal (Lamotrigine), Synthroid (levothroxine), and  Claritin (loratadine) if needed.  Stop taking Aspirin, Aleve, BC's, Goody's, Ibuprofen, Fish Oil, and Herbal medications   Do not wear jewelry, make-up or nail polish.  Do not wear lotions, powders, or perfumes. You may wear deodorant.  Do not shave 48 hours prior to surgery. Men may shave face and neck.  Do not bring valuables to the hospital.  [facility] is not responsible                   for any belongings or valuables.  Contacts, dentures or bridgework may not be worn into surgery.  Leave suitcase in the car. After surgery it may be brought to your room.  For patients admitted to the hospital, checkout time is 11:00 AM the day of  discharge.   Patients discharged the day of surgery will not be allowed to drive  home.    Special Instructions: Shower using CHG 2 nights before surgery and the night before surgery.  If you shower the day of surgery use CHG.  Use special wash - you have one bottle of CHG for all showers.  You should use approximately 1/3 of the bottle for each shower.   Please read over the following fact sheets that you were given: Pain Booklet, Coughing and Deep Breathing and Surgical Site Infection Prevention

## 2013-05-24 NOTE — Progress Notes (Addendum)
Anesthesia Chart Review:  Patient is a 67 year old female scheduled for needle localized right breast lumpectomy and SN biopsy on 05/30/13 by Dr. Magnus Ivan.  History includes right breast cancer, non-smoker, post-operative N/V, DM2, renal cysts, CKD, HLD, nephrolithiasis, HTN, pancreatic cancer s/p subtotal pancreatectomy, cholecystectomy, and splenectomy at [facility] 2 09/2011, thyroidectomy with secondary hypothyroidism, anemia, incisional hernia, left TKA, sarcoma (buttucks), murmur (not specified), hysterectomy. PCP is Dr. Darci Needle.    EKG on 05/24/13 showed NSR, LAD, poor r wave progression, non-specific intraventricular conduction block, inferior T wave abnormality.  Right BBB pattern no longer seen in V2 when compared to her EKG from 04/19/11.  CXR on 05/24/13 showed no acuate cardiopulmonary process.  Preoperative labs showed BUN 50, Cr 2.09, glucose 182.  CBC WNL. She has known history of CKD, but I don't have any recent comparison labs.  Her PCP office is now closed.  I will follow-up next week.   Velna Ochs [facility]/Anesthesiology Phone [phone] 05/24/2013 4:25 PM  Addendum: 05/28/13 12:50 PM Records and comparison labs received from [facility].  Patient's BUN/Cr have been 49-58/1.9-2.3 since 10/05/12.  Her renal function appears at baseline, so I will not order any additional preoperative labs.  Continued out-patient follow-up with Dr. Juleen China regarding CKD.

## 2013-05-29 MED ORDER — CEFAZOLIN SODIUM-DEXTROSE 2-3 GM-% IV SOLR
2.0000 g | INTRAVENOUS | Status: DC
  Filled 2013-05-29: qty 50

## 2013-05-29 NOTE — H&P (Signed)
Patient ID: [name], female DOB: 1946-07-10, 66 y.o. MRN: [id number]  Chief Complaint   Patient presents with   .  New Evaluation     new br cancer in right breast   HPI  Kristi Fuentes is a 67 y.o. female.  HPI  This is a very pleasant patient that I have operated on in the past for a lipoma on her arm. She now, unfortunately, has been found to have a right breast cancer. She was operated on in [place] for pancreatic cancer this past February. Regarding her breast, she has had no nipple discharge. She is otherwise without complaints today  Past Medical History   Diagnosis  Date   .  Diabetes mellitus    .  Hyperlipidemia    .  Hypertension    .  Chronic kidney disease    .  Kidney stones    .  Allergy    .  Pancreatic cancer    .  Primary pancreatic neuroendocrine tumor    .  Blood transfusion without reported diagnosis    .  Heart murmur    .  Thyroid disease     Past Surgical History   Procedure  Laterality  Date   .  Buttock sarcoma     .  Thyroidectomy     .  Abdominal hysterectomy     .  Total knee arthroplasty       left   .  Lipoma excision   04/20/2011     left arm   .  Cholecystectomy     .  Spleenectomy     .  Breast surgery     .  Urethral stricture dilatation   [id number]    Family History   Problem  Relation  Age of Onset   .  Diabetes  Father    .  Kidney disease  Father    .  Cancer  Father      colon   .  Cancer  Maternal Grandmother      breast   Social History  History   Substance Use Topics   .  Smoking status:  Never Smoker   .  Smokeless tobacco:  Never Used   .  Alcohol Use:  No    Allergies   Allergen  Reactions   .  Metoclopramide Hcl      Tardive dyskinesia-type symptoms   .  Ramipril      Cough    Current Outpatient Prescriptions   Medication  Sig  Dispense  Refill   .  amLODipine (NORVASC) 5 MG tablet  Take 5 mg by mouth daily.     Marland Kitchen  atenolol (TENORMIN) 25 MG tablet  Take 25 mg by mouth daily.     .   insulin aspart (NOVOLOG) 100 UNIT/ML injection  Inject into the skin 3 (three) times daily with meals.     .  insulin glargine (LANTUS) 100 UNIT/ML injection  Inject into the skin at bedtime.     .  lamoTRIgine (LAMICTAL) 200 MG tablet  Take 200 mg by mouth daily.     Marland Kitchen  levothyroxine (SYNTHROID, LEVOTHROID) 150 MCG tablet  Take 150 mcg by mouth daily.     Marland Kitchen  loratadine (CLARITIN) 10 MG tablet  Take 10 mg by mouth daily.     .  pravastatin (PRAVACHOL) 80 MG tablet  Take 80 mg by mouth daily.     Marland Kitchen  zolpidem (AMBIEN  CR) 12.5 MG CR tablet  Take 10 mg by mouth at bedtime as needed.     Marland Kitchen  aspirin 81 MG tablet  Take 81 mg by mouth daily.     Marland Kitchen  enoxaparin (LOVENOX) 40 MG/0.4ML SOLN  Inject into the skin daily.     .  fish oil-omega-3 fatty acids 1000 MG capsule  Take 2 g by mouth daily.     Marland Kitchen  glimepiride (AMARYL) 2 MG tablet  Take 2 mg by mouth daily before breakfast. 1/2     .  oxycodone (OXY-IR) 5 MG capsule  Take 5 mg by mouth every 4 (four) hours as needed.     .  pantoprazole (PROTONIX) 40 MG tablet  Take 40 mg by mouth daily.      No current facility-administered medications for this visit.   Review of Systems  Review of Systems  Constitutional: Negative for fever, chills and unexpected weight change.  HENT: Negative for hearing loss, congestion, sore throat, trouble swallowing and voice change.  Eyes: Negative for visual disturbance.  Respiratory: Negative for cough and wheezing.  Cardiovascular: Negative for chest pain, palpitations and leg swelling.  Gastrointestinal: Negative for nausea, vomiting, abdominal pain, diarrhea, constipation, blood in stool, abdominal distention and anal bleeding.  Genitourinary: Negative for hematuria, vaginal bleeding and difficulty urinating.  Musculoskeletal: Negative for arthralgias.  Skin: Negative for rash and wound.  Neurological: Negative for seizures, syncope and headaches.  Hematological: Negative for adenopathy. Does not bruise/bleed easily.   Psychiatric/Behavioral: Negative for confusion.  Blood pressure 140/80, pulse 60, temperature 98.4 F (36.9 C), temperature source Temporal, resp. rate 14, height 5' 2.5" (1.588 m), weight 159 lb 12.8 oz (72.485 kg).  Physical Exam  Physical Exam  Constitutional: She is oriented to person, place, and time. She appears well-developed and well-nourished. No distress.  HENT:  Head: Normocephalic and atraumatic.  Right Ear: External ear normal.  Left Ear: External ear normal.  Nose: Nose normal.  Mouth/Throat: Oropharynx is clear and moist. No oropharyngeal exudate.  Eyes: Conjunctivae are normal. Pupils are equal, round, and reactive to light.  Neck: Normal range of motion. Neck supple. No tracheal deviation present.  Cardiovascular: Normal rate, regular rhythm, normal heart sounds and intact distal pulses.  No murmur heard.  Pulmonary/Chest: Effort normal and breath sounds normal. No respiratory distress. She has no wheezes.  Abdominal: Soft. Bowel sounds are normal. She exhibits no distension. There is no tenderness.  Well-healed midline incision  Musculoskeletal: Normal range of motion. She exhibits no edema and no tenderness.  Lymphadenopathy:  She has no cervical adenopathy.  Neurological: She is alert and oriented to person, place, and time.  Skin: Skin is warm and dry. No rash noted. No erythema.  Psychiatric: Her behavior is normal. Judgment normal.  Breasts: There is ecchymosis from a biopsy of the right breast. There is mild fullness of the 12:00 position. There are no other masses in either breast  There is no axillary adenopathy on either side  Data Reviewed  I have reviewed her pathology results a mammogram demonstrating the right breast cancer at the 12:00 position that is 1.5 cm in size. It is ER and PR positive.  I have also reviewed her most recent laboratory data from [facility] in [place]. Her GFR is 25  Assessment  Right breast cancer  Plan   Unfortunately, she cannot undergo an MRI given her renal function. I discussed conservative treatment with lumpectomy followed by radiation first mastectomy. She wishes to proceed  with breast conservation. This would be a needle localized lumpectomy on the right side with right axillary sentinel lymph node biopsy. I discussed this with the patient and her husband in detail. I discussed the risks of surgery which includes but not limited to bleeding, infection, injury to stranding structures, seroma formation, need for further surgery if the margins are positive, et Karie Soda. She understands and wishes to proceed. Surgery will be scheduled

## 2013-05-30 ENCOUNTER — Encounter (HOSPITAL_COMMUNITY)
Admission: RE | Admit: 2013-05-30 | Discharge: 2013-05-30 | Disposition: A | Discharge status: Home / Self Care | Payer: Medicare Other | Source: Ambulatory Visit | Attending: Surgery | Admitting: Surgery

## 2013-05-30 ENCOUNTER — Ambulatory Visit (HOSPITAL_COMMUNITY)
Admission: RE | Admit: 2013-05-30 | Discharge: 2013-05-30 | Disposition: A | Discharge status: Home / Self Care | Payer: Medicare Other | Source: Ambulatory Visit | Attending: Surgery | Admitting: Surgery

## 2013-05-30 ENCOUNTER — Encounter (HOSPITAL_COMMUNITY)
Admission: RE | Disposition: A | Discharge status: Home / Self Care | Payer: Self-pay | Source: Ambulatory Visit | Attending: Surgery

## 2013-05-30 ENCOUNTER — Ambulatory Visit (HOSPITAL_COMMUNITY): Payer: Medicare Other | Admitting: Anesthesiology

## 2013-05-30 ENCOUNTER — Ambulatory Visit
Admission: RE | Admit: 2013-05-30 | Discharge: 2013-05-30 | Disposition: A | Discharge status: Home / Self Care | Payer: Medicare Other | Source: Ambulatory Visit | Attending: Surgery | Admitting: Surgery

## 2013-05-30 ENCOUNTER — Encounter (HOSPITAL_COMMUNITY): Payer: Self-pay | Admitting: Vascular Surgery

## 2013-05-30 ENCOUNTER — Encounter (HOSPITAL_COMMUNITY): Payer: Self-pay | Admitting: *Deleted

## 2013-05-30 DIAGNOSIS — D0590 Unspecified type of carcinoma in situ of unspecified breast: Secondary | ICD-10-CM | POA: Insufficient documentation

## 2013-05-30 DIAGNOSIS — N189 Chronic kidney disease, unspecified: Secondary | ICD-10-CM | POA: Insufficient documentation

## 2013-05-30 DIAGNOSIS — C50911 Malignant neoplasm of unspecified site of right female breast: Secondary | ICD-10-CM

## 2013-05-30 DIAGNOSIS — I129 Hypertensive chronic kidney disease with stage 1 through stage 4 chronic kidney disease, or unspecified chronic kidney disease: Secondary | ICD-10-CM | POA: Insufficient documentation

## 2013-05-30 DIAGNOSIS — C50919 Malignant neoplasm of unspecified site of unspecified female breast: Secondary | ICD-10-CM | POA: Insufficient documentation

## 2013-05-30 DIAGNOSIS — Z794 Long term (current) use of insulin: Secondary | ICD-10-CM | POA: Insufficient documentation

## 2013-05-30 DIAGNOSIS — D3A098 Benign carcinoid tumors of other sites: Secondary | ICD-10-CM | POA: Insufficient documentation

## 2013-05-30 DIAGNOSIS — Z79899 Other long term (current) drug therapy: Secondary | ICD-10-CM | POA: Insufficient documentation

## 2013-05-30 DIAGNOSIS — E119 Type 2 diabetes mellitus without complications: Secondary | ICD-10-CM | POA: Insufficient documentation

## 2013-05-30 HISTORY — PX: BREAST LUMPECTOMY WITH NEEDLE LOCALIZATION AND AXILLARY SENTINEL LYMPH NODE BX: SHX5760

## 2013-05-30 HISTORY — DX: Major depressive disorder, single episode, unspecified: F32.9

## 2013-05-30 HISTORY — DX: Depression, unspecified: F32.A

## 2013-05-30 SURGERY — BREAST LUMPECTOMY WITH NEEDLE LOCALIZATION AND AXILLARY SENTINEL LYMPH NODE BX
Anesthesia: General | Laterality: Right | Wound class: Clean

## 2013-05-30 MED ORDER — HYDROCODONE-ACETAMINOPHEN 5-325 MG PO TABS: 1.0000 | ORAL_TABLET | ORAL | Status: DC | PRN

## 2013-05-30 MED ORDER — HYDROMORPHONE HCL PF 1 MG/ML IJ SOLN: 0.2500 mg | INTRAMUSCULAR | Status: DC | PRN

## 2013-05-30 MED ORDER — BUPIVACAINE-EPINEPHRINE 0.25% -1:200000 IJ SOLN
INTRAMUSCULAR | Status: DC | PRN
  Administered 2013-05-30: 20 mL

## 2013-05-30 MED ORDER — PROPOFOL 10 MG/ML IV BOLUS
INTRAVENOUS | Status: DC | PRN
  Administered 2013-05-30: 100 mg via INTRAVENOUS
  Administered 2013-05-30: 70 mg via INTRAVENOUS

## 2013-05-30 MED ORDER — FENTANYL CITRATE 0.05 MG/ML IJ SOLN
INTRAMUSCULAR | Status: DC | PRN
  Administered 2013-05-30 (×2): 50 ug via INTRAVENOUS

## 2013-05-30 MED ORDER — LACTATED RINGERS IV SOLN
INTRAVENOUS | Status: DC
  Administered 2013-05-30: 09:00:00 via INTRAVENOUS

## 2013-05-30 MED ORDER — EPHEDRINE SULFATE 50 MG/ML IJ SOLN
INTRAMUSCULAR | Status: DC | PRN
  Administered 2013-05-30: 15 mg via INTRAVENOUS
  Administered 2013-05-30 (×2): 10 mg via INTRAVENOUS
  Administered 2013-05-30: 15 mg via INTRAVENOUS

## 2013-05-30 MED ORDER — ONDANSETRON HCL 4 MG/2ML IJ SOLN: 4.0000 mg | Freq: Once | INTRAMUSCULAR | Status: DC | PRN

## 2013-05-30 MED ORDER — SODIUM CHLORIDE 0.9 % IJ SOLN
INTRAMUSCULAR | Status: DC | PRN
  Administered 2013-05-30: 3 mL via INTRAVENOUS

## 2013-05-30 MED ORDER — TECHNETIUM TC 99M SULFUR COLLOID FILTERED
1.0000 | Freq: Once | INTRAVENOUS | Status: AC | PRN
  Administered 2013-05-30: 1 via INTRADERMAL

## 2013-05-30 MED ORDER — FENTANYL CITRATE 0.05 MG/ML IJ SOLN: 100.0000 ug | Freq: Once | INTRAMUSCULAR | Status: DC

## 2013-05-30 MED ORDER — METHYLENE BLUE 1 % INJ SOLN
INTRAMUSCULAR | Status: DC | PRN
  Administered 2013-05-30: 2 mL via SUBMUCOSAL

## 2013-05-30 MED ORDER — LIDOCAINE HCL (CARDIAC) 20 MG/ML IV SOLN
INTRAVENOUS | Status: DC | PRN
  Administered 2013-05-30: 30 mg via INTRAVENOUS

## 2013-05-30 MED ORDER — LACTATED RINGERS IV SOLN
INTRAVENOUS | Status: DC | PRN
  Administered 2013-05-30: 09:00:00 via INTRAVENOUS

## 2013-05-30 MED ORDER — FENTANYL CITRATE 0.05 MG/ML IJ SOLN
INTRAMUSCULAR | Status: AC
  Administered 2013-05-30: 100 ug
  Filled 2013-05-30: qty 2

## 2013-05-30 SURGICAL SUPPLY — 48 items
APL SKNCLS STERI-STRIP NONHPOA (GAUZE/BANDAGES/DRESSINGS) ×1
APPLIER CLIP 9.375 MED OPEN (MISCELLANEOUS) ×2
APR CLP MED 9.3 20 MLT OPN (MISCELLANEOUS) ×1
BENZOIN TINCTURE PRP APPL 2/3 (GAUZE/BANDAGES/DRESSINGS) ×2 IMPLANT
BINDER BREAST LRG (GAUZE/BANDAGES/DRESSINGS) IMPLANT
BINDER BREAST XLRG (GAUZE/BANDAGES/DRESSINGS) IMPLANT
BLADE SURG 15 STRL LF DISP TIS (BLADE) ×1 IMPLANT
BLADE SURG 15 STRL SS (BLADE) ×2
CANISTER SUCTION 2500CC (MISCELLANEOUS) ×2 IMPLANT
CHLORAPREP W/TINT 26ML (MISCELLANEOUS) ×2 IMPLANT
CLIP APPLIE 9.375 MED OPEN (MISCELLANEOUS) IMPLANT
CLOTH BEACON ORANGE TIMEOUT ST (SAFETY) ×2 IMPLANT
CONT SPEC 4OZ CLIKSEAL STRL BL (MISCELLANEOUS) ×2 IMPLANT
COVER PROBE W GEL 5X96 (DRAPES) ×2 IMPLANT
COVER SURGICAL LIGHT HANDLE (MISCELLANEOUS) ×2 IMPLANT
DEVICE DUBIN SPECIMEN MAMMOGRA (MISCELLANEOUS) ×1 IMPLANT
DRAPE LAPAROSCOPIC ABDOMINAL (DRAPES) ×2 IMPLANT
DRAPE UTILITY 15X26 W/TAPE STR (DRAPE) ×3 IMPLANT
DRSG TEGADERM 4X4.75 (GAUZE/BANDAGES/DRESSINGS) ×2 IMPLANT
ELECT CAUTERY BLADE 6.4 (BLADE) ×2 IMPLANT
ELECT REM PT RETURN 9FT ADLT (ELECTROSURGICAL) ×2
ELECTRODE REM PT RTRN 9FT ADLT (ELECTROSURGICAL) ×1 IMPLANT
GLOVE SURG SIGNA 7.5 PF LTX (GLOVE) ×3 IMPLANT
GOWN STRL NON-REIN LRG LVL3 (GOWN DISPOSABLE) ×2 IMPLANT
GOWN STRL REIN XL XLG (GOWN DISPOSABLE) ×2 IMPLANT
KIT BASIN OR (CUSTOM PROCEDURE TRAY) ×2 IMPLANT
KIT MARKER MARGIN INK (KITS) ×2 IMPLANT
KIT ROOM TURNOVER OR (KITS) ×2 IMPLANT
NDL 18GX1X1/2 (RX/OR ONLY) (NEEDLE) ×1 IMPLANT
NDL HYPO 25GX1X1/2 BEV (NEEDLE) ×2 IMPLANT
NEEDLE 18GX1X1/2 (RX/OR ONLY) (NEEDLE) ×2 IMPLANT
NEEDLE HYPO 25GX1X1/2 BEV (NEEDLE) ×4 IMPLANT
NS IRRIG 1000ML POUR BTL (IV SOLUTION) ×2 IMPLANT
PACK SURGICAL SETUP 50X90 (CUSTOM PROCEDURE TRAY) ×2 IMPLANT
PAD ARMBOARD 7.5X6 YLW CONV (MISCELLANEOUS) ×2 IMPLANT
PENCIL BUTTON HOLSTER BLD 10FT (ELECTRODE) ×2 IMPLANT
SPONGE GAUZE 4X4 12PLY (GAUZE/BANDAGES/DRESSINGS) ×2 IMPLANT
SPONGE LAP 4X18 X RAY DECT (DISPOSABLE) ×2 IMPLANT
STRIP CLOSURE SKIN 1/2X4 (GAUZE/BANDAGES/DRESSINGS) ×2 IMPLANT
SUT MON AB 4-0 PC3 18 (SUTURE) ×2 IMPLANT
SUT VIC AB 3-0 SH 27 (SUTURE) ×2
SUT VIC AB 3-0 SH 27XBRD (SUTURE) ×1 IMPLANT
SYR BULB 3OZ (MISCELLANEOUS) ×2 IMPLANT
SYR CONTROL 10ML LL (SYRINGE) ×4 IMPLANT
TOWEL OR 17X24 6PK STRL BLUE (TOWEL DISPOSABLE) ×2 IMPLANT
TOWEL OR 17X26 10 PK STRL BLUE (TOWEL DISPOSABLE) ×2 IMPLANT
TUBE CONNECTING 12X1/4 (SUCTIONS) ×2 IMPLANT
YANKAUER SUCT BULB TIP NO VENT (SUCTIONS) ×2 IMPLANT

## 2013-05-30 NOTE — Preoperative (Signed)
Beta Blockers   Reason not to administer Beta Blockers:Not Applicable 

## 2013-05-30 NOTE — Anesthesia Preprocedure Evaluation (Signed)
Anesthesia Evaluation  Patient identified by MRN, date of birth, ID band Patient awake    Reviewed: Allergy & Precautions, H&P , NPO status , Patient's Chart, lab work & pertinent test results  History of Anesthesia Complications (+) PONV  Airway       Dental   Pulmonary          Cardiovascular hypertension, + Valvular Problems/Murmurs     Neuro/Psych    GI/Hepatic   Endo/Other  diabetes, Type 2, Insulin DependentHypothyroidism   Renal/GU      Musculoskeletal   Abdominal   Peds  Hematology   Anesthesia Other Findings   Reproductive/Obstetrics                           Anesthesia Physical Anesthesia Plan  ASA: III  Anesthesia Plan: General   Post-op Pain Management:    Induction: Intravenous  Airway Management Planned: Oral ETT  Additional Equipment:   Intra-op Plan:   Post-operative Plan: Extubation in OR  Informed Consent: I have reviewed the patients History and Physical, chart, labs and discussed the procedure including the risks, benefits and alternatives for the proposed anesthesia with the patient or authorized representative who has indicated his/her understanding and acceptance.     Plan Discussed with: CRNA, Anesthesiologist and Surgeon  Anesthesia Plan Comments:         Anesthesia Quick Evaluation

## 2013-05-30 NOTE — Anesthesia Procedure Notes (Signed)
Procedure Name: LMA Insertion Date/Time: 05/30/2013 9:32 AM Performed by: Fransisca Kaufmann Pre-anesthesia Checklist: Patient identified, Emergency Drugs available, Suction available, Patient being monitored and Timeout performed Patient Re-evaluated:Patient Re-evaluated prior to inductionOxygen Delivery Method: Circle system utilized Preoxygenation: Pre-oxygenation with 100% oxygen Intubation Type: IV induction LMA: LMA inserted LMA Size: 4.0 Tube type: Oral Number of attempts: 1 Placement Confirmation: positive ETCO2 and breath sounds checked- equal and bilateral Tube secured with: Tape Dental Injury: Teeth and Oropharynx as per pre-operative assessment

## 2013-05-30 NOTE — Anesthesia Postprocedure Evaluation (Signed)
  Anesthesia Post-op Note  Patient: Kristi Fuentes  Procedure(s) Performed: Procedure(s): BREAST LUMPECTOMY WITH NEEDLE LOCALIZATION AND AXILLARY SENTINEL LYMPH NODE BX (Right)  Patient Location: PACU  Anesthesia Type:General  Level of Consciousness: awake, alert , oriented and patient cooperative  Airway and Oxygen Therapy: Patient Spontanous Breathing  Post-op Pain: mild  Post-op Assessment: Post-op Vital signs reviewed, Patient's Cardiovascular Status Stable, Respiratory Function Stable, Patent Airway, No signs of Nausea or vomiting and Pain level controlled  Post-op Vital Signs: stable  Complications: No apparent anesthesia complications

## 2013-05-30 NOTE — Interval H&P Note (Signed)
History and Physical Interval Note: no change in H and P  05/30/2013 8:48 AM  Kristi Fuentes  has presented today for surgery, with the diagnosis of breast cancer  The various methods of treatment have been discussed with the patient and family. After consideration of risks, benefits and other options for treatment, the patient has consented to  Procedure(s): BREAST LUMPECTOMY WITH NEEDLE LOCALIZATION AND AXILLARY SENTINEL LYMPH NODE BX (Right) as a surgical intervention .  The patient's history has been reviewed, patient examined, no change in status, stable for surgery.  I have reviewed the patient's chart and labs.  Questions were answered to the patient's satisfaction.     Maxine Fredman A

## 2013-05-30 NOTE — Op Note (Signed)
BREAST LUMPECTOMY WITH NEEDLE LOCALIZATION AND AXILLARY SENTINEL LYMPH NODE BX  Procedure Note  Kristi Fuentes 05/30/2013   Pre-op Diagnosis: right breast cancer     Post-op Diagnosis: same  Procedure(s): NEEDLE LOCALIZED RIGHT BREAST PARTIAL MASTECTOMY WITH SENTINEL LYMPH NODE BIOPSY INJECTION OF BLUE DYE  Surgeon(s): Shelly Rubenstein, MD  Anesthesia: General  Staff:  Circulator: Lina Sayre, RN Scrub Person: Leighton Parody, CST Circulator Assistant: Pauletta Browns, RN  Estimated Blood Loss: Minimal               Specimens: sent to path          [facility] A   Date: 05/30/2013  Time: 10:19 AM

## 2013-05-30 NOTE — Transfer of Care (Signed)
Immediate Anesthesia Transfer of Care Note  Patient: Kristi Fuentes  Procedure(s) Performed: Procedure(s): BREAST LUMPECTOMY WITH NEEDLE LOCALIZATION AND AXILLARY SENTINEL LYMPH NODE BX (Right)  Patient Location: PACU  Anesthesia Type:General  Level of Consciousness: awake, alert , oriented and sedated  Airway & Oxygen Therapy: Patient Spontanous Breathing and Patient connected to nasal cannula oxygen  Post-op Assessment: Report given to PACU RN, Post -op Vital signs reviewed and stable and Patient moving all extremities  Post vital signs: Reviewed and stable  Complications: No apparent anesthesia complications

## 2013-05-31 NOTE — Op Note (Signed)
NAME:  Kristi Fuentes, KLAHN NO.:  [id number]  MEDICAL RECORD NO.:  [id number]  LOCATION:  [facility]                         FACILITY:  [facility]  PHYSICIAN:  Abigail Miyamoto, M.D. DATE OF BIRTH:  February 06, 1946  DATE OF PROCEDURE:  05/30/2013 DATE OF DISCHARGE:  05/30/2013                              OPERATIVE REPORT   PREOPERATIVE DIAGNOSIS:  Right breast cancer.  POSTOPERATIVE DIAGNOSIS: Right breast cancer.  PROCEDURES: 1. Needle localized right breast partial mastectomy with sentinel     lymph node biopsy. 2. Injection of blue dye.  SURGEON:  Abigail Miyamoto, M.D.  ANESTHESIA:  General and 0.5% Marcaine.  ESTIMATED BLOOD LOSS:  Minimal.  INDICATIONS:  This is a 67 year old female who was found to have a mass on mammography of the right breast.  Stereotactic biopsy was performed that showed an invasive cancer.  Because of her poor renal function, a preoperative MRI could not be performed.  FINDINGS:  The mass was removed and confirmed on radiography to be present in the mastectomy specimen.  One sentinel lymph node was removed and sent to pathology.  PROCEDURE IN DETAIL:  The patient was brought to the operating room, identified as [name].  She was placed on the operating table and general anesthesia was induced.  She already had radioactive isotope injected to the right breast by the technologist in the holding area.  I then injected blue dye underneath the areola and nipple, and massaged the breast gently.  The patient's right breast and axilla were then prepped and draped in usual sterile fashion.  I anesthetized the skin around localization wire with Marcaine.  Localization wire was in the 12 o'clock position on the breast.  I made elliptical incision around the localization wire with a scalpel.  I took this down to the breast tissue with electrocautery.  I then did a partial mastectomy going down to the chest wall, and while the around the palpable  mass with the electrocautery.  I went slightly underneath the superior aspect of the nipple-areolar complex as well.  The large lumpectomy specimen was then completely removed.  I marked all margins with a marker paint.  X-ray confirmed the suspicious area was in the partial mastectomy specimen. The specimen was then sent to pathology for evaluation.  Hemostasis was then achieved with the cautery.  The Neoprobe was then brought to the field.  I identified an area of increased uptake in the right axilla.  I anesthetized the skin with Marcaine.  I made a small incision with a scalpel.  I took this down to the axillary tissue with electrocautery. Using the Neoprobe in the blue dye, I was able to identify one sentinel lymph node.  The node had uptake of the radioisotope as well as the blue dye.  This was sent to pathology for evaluation.  I then examined the nodal base and found no other increased uptake and no other blue nodes. I then anesthetized both wounds further with Marcaine.  I irrigated them with saline and achieved hemostasis with cautery.  I then closed subcutaneous tissue with interrupted 3-0 Vicryl sutures and closed skin with running 4-0 Monocryl.  Steri-Strips, gauze, and Tegaderm  were then applied.  The patient tolerated the procedure well.  All the counts were correct at the end of procedure.  The patient was then extubated in the operating room and taken in stable condition to the recovery room.     Abigail Miyamoto, M.D.     DB/MEDQ  D:  05/30/2013  T:  05/31/2013  Job:  [id number]

## 2013-06-04 ENCOUNTER — Encounter (HOSPITAL_COMMUNITY): Payer: Self-pay | Admitting: Surgery

## 2013-06-17 ENCOUNTER — Other Ambulatory Visit (INDEPENDENT_AMBULATORY_CARE_PROVIDER_SITE_OTHER): Payer: Self-pay | Admitting: Surgery

## 2013-06-17 ENCOUNTER — Encounter (INDEPENDENT_AMBULATORY_CARE_PROVIDER_SITE_OTHER): Payer: Self-pay | Admitting: Surgery

## 2013-06-17 ENCOUNTER — Ambulatory Visit (INDEPENDENT_AMBULATORY_CARE_PROVIDER_SITE_OTHER): Payer: Medicare Other | Admitting: Surgery

## 2013-06-17 ENCOUNTER — Telehealth: Payer: Self-pay | Admitting: *Deleted

## 2013-06-17 VITALS — BP 160/82 | HR 60 | Temp 99.1°F | Resp 14 | Ht 63.0 in | Wt 159.0 lb

## 2013-06-17 DIAGNOSIS — Z09 Encounter for follow-up examination after completed treatment for conditions other than malignant neoplasm: Secondary | ICD-10-CM

## 2013-06-17 DIAGNOSIS — C50911 Malignant neoplasm of unspecified site of right female breast: Secondary | ICD-10-CM

## 2013-06-17 NOTE — Progress Notes (Signed)
Subjective:     Patient ID: Kristi Fuentes, female   DOB: 01/21/1946, 67 y.o.   MRN: [id number]  HPI She is here for first postop visit status post right breast lumpectomy and sentinel biopsy. She is doing well and has no complaints  Review of Systems     Objective:   Physical Exam On exam, the incisions are well-healed without evidence of infection. The final pathology confirmed the diagnosis of the invasive right breast cancer. Margins were negative and the sentinel lymph node was negative as well    Assessment:     Patient stable postop     Plan:     She will now be referred to medical and radiation oncologist for further opinions. I will see her back in 3 months

## 2013-06-17 NOTE — Telephone Encounter (Signed)
Left message for pt to return my call so I can schedule a Med Onc appt. 

## 2013-06-18 NOTE — Progress Notes (Addendum)
Location of Breast Cancer: Right Breast  Histology per Pathology Report:  05/30/13 Diagnosis 1. Breast, lumpectomy, Right - INVASIVE GRADE II DUCTAL CARCINOMA WITH ASSOCIATED MICROCALCIFICATION, SPANNING 1.5 CM IN GREATEST DIMENSION. - ASSOCIATED INTERMEDIATE GRADE DUCTAL CARCINOMA IN SITU. - INVASIVE CARCINOMA IS 0.2 CM AWAY FROM POSTERIOR / INFERIOR MARGIN. - OTHER MARGINS ARE NEGATIVE. - SEE ONCOLOGY TEMPLATE. 2. Lymph node, sentinel, biopsy, Right axillary #1 - ONE BENIGN LYMPH NODE WITH NO TUMOR SEEN (0/1).   Receptor Status: ER(100%), PR (30%), Her2-neu (No amplification), Ki-67(19%)  Did patient present with symptoms (if so, please note symptoms) or was this found on screening mammography?: Found on mammogram  Past/Anticipated interventions by surgeon, if any: 05/30/13 Lumpectomy Right Breast  Past/Anticipated interventions by medical oncology, if any: Chemotherapy : None  Lymphedema issues, if any: None  Pain issues, if any:  NO  Menarche age 69, parity age 46, G86, P30, adopted daughters, BC x 3-4 years, No HRT  SAFETY ISSUES:  Prior radiation? No  Pacemaker/ICD? No  Possible current pregnancy?No  Is the patient on methotrexate? No  Current Complaints / other details:  Completed chemotherapy with Gemzar at [facility] for pancreatic cancer. 20 % of pancreas left. Di not receive all chemotherapy due to renal failure and herGFR is is the mid 20's

## 2013-06-19 ENCOUNTER — Ambulatory Visit
Admission: RE | Admit: 2013-06-19 | Discharge: 2013-06-19 | Disposition: A | Discharge status: Home / Self Care | Payer: Medicare Other | Source: Ambulatory Visit | Attending: Radiation Oncology | Admitting: Radiation Oncology

## 2013-06-19 ENCOUNTER — Telehealth: Payer: Self-pay | Admitting: *Deleted

## 2013-06-19 ENCOUNTER — Encounter: Payer: Self-pay | Admitting: Radiation Oncology

## 2013-06-19 VITALS — BP 151/69 | HR 59 | Temp 98.2°F | Ht 63.0 in | Wt 161.8 lb

## 2013-06-19 DIAGNOSIS — Z85850 Personal history of malignant neoplasm of thyroid: Secondary | ICD-10-CM | POA: Insufficient documentation

## 2013-06-19 DIAGNOSIS — E785 Hyperlipidemia, unspecified: Secondary | ICD-10-CM | POA: Insufficient documentation

## 2013-06-19 DIAGNOSIS — E119 Type 2 diabetes mellitus without complications: Secondary | ICD-10-CM | POA: Insufficient documentation

## 2013-06-19 DIAGNOSIS — I10 Essential (primary) hypertension: Secondary | ICD-10-CM | POA: Insufficient documentation

## 2013-06-19 DIAGNOSIS — Z8589 Personal history of malignant neoplasm of other organs and systems: Secondary | ICD-10-CM | POA: Insufficient documentation

## 2013-06-19 DIAGNOSIS — Z79899 Other long term (current) drug therapy: Secondary | ICD-10-CM | POA: Insufficient documentation

## 2013-06-19 DIAGNOSIS — Z794 Long term (current) use of insulin: Secondary | ICD-10-CM | POA: Insufficient documentation

## 2013-06-19 DIAGNOSIS — Z8509 Personal history of malignant neoplasm of other digestive organs: Secondary | ICD-10-CM | POA: Insufficient documentation

## 2013-06-19 DIAGNOSIS — Z170 Estrogen receptor positive status [ER+]: Secondary | ICD-10-CM | POA: Insufficient documentation

## 2013-06-19 DIAGNOSIS — C50919 Malignant neoplasm of unspecified site of unspecified female breast: Secondary | ICD-10-CM | POA: Insufficient documentation

## 2013-06-19 DIAGNOSIS — C50111 Malignant neoplasm of central portion of right female breast: Secondary | ICD-10-CM

## 2013-06-19 DIAGNOSIS — C50911 Malignant neoplasm of unspecified site of right female breast: Secondary | ICD-10-CM

## 2013-06-19 HISTORY — DX: Unspecified abdominal hernia without obstruction or gangrene: K46.9

## 2013-06-19 HISTORY — DX: Personal history of antineoplastic chemotherapy: Z92.21

## 2013-06-19 HISTORY — DX: Malignant neoplasm of unspecified site of unspecified female breast: C50.919

## 2013-06-19 HISTORY — DX: Malignant neoplasm of connective and soft tissue, unspecified: C49.9

## 2013-06-19 HISTORY — DX: Unspecified kidney failure: N19

## 2013-06-19 NOTE — Telephone Encounter (Signed)
Pt returned my call and I confirmed 06/24/13 appt w/ pt.  Mailed before appt letter & packet to pt.  Emailed Annie at [facility] to make her aware.  Took paperwork to Med Rec for chart.

## 2013-06-19 NOTE — Progress Notes (Signed)
Radiation Oncology         [phone] ________________________________  Initial outpatient Consultation  Name: Kristi Fuentes MRN: [id number]  Date: 06/19/2013  DOB: 08-30-1946  WU:[id number],JYNWGN DENNIS, MD  Shelly Rubenstein, MD   REFERRING PHYSICIAN: Shelly Rubenstein, MD  DIAGNOSIS: pT1cN0M0 Grade II IDC, right breast, triple positive.  HISTORY OF PRESENT ILLNESS::Kristi Fuentes is a 67 y.o. female who has a complicated history of prior cancers but most recently was diagnosed with pathologic T1c N0 right breast cancer. 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 She underwent thyroidectomy in 2002 and radioactive iodine treatments in 2003 and 2005. 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 The exact type of histology could not be determined after extensive pathologic review. No adjuvant treatments given. She also had a history of 2 tumors in her pancreas which were treated at [facility]. According to an extensive history from the patient and her husband one of the tumors was a neuroendocrine tumor while the other was an adenocarcinoma. She was reportedly stage I per the patient and her husband. She was diagnosed almost 2 years ago and was treated with surgery followed by Gemzar chemotherapy. She had an incomplete course of chemotherapy due to renal issues. She did not receive any radiotherapy. She has been without evidence of disease in terms of her thyroid, sarcoma, and pancreatic cancers since treatments. Her breast cancer was detected by screening mammogram. No MRIs were done. She most recently underwent a lumpectomy of the right breast on 05/30/2013. I've reviewed the pathology. The margins are negative by at least 0.2 cm for invasive and in situ disease. There was no definitive lymphovascular space invasion. The tumor was unifocal. It  was grade 2 and triple positive. The single sentinel lymph node was negative.  She has not yet seen medical oncology but has plans to see them on October 20. She and her husband are very thorough historians and have multiple questions and concerns today. Particularly, her husband is concerned about side effects of treatment, understandably since she had significant side effects from Gemzar chemotherapy for her pancreatic cancer. They have many questions regarding the rationale of radiotherapy and chemotherapy.  Pathology details: 1. Breast, lumpectomy, Right - INVASIVE GRADE II DUCTAL CARCINOMA WITH ASSOCIATED MICROCALCIFICATION, SPANNING 1.5 CM IN GREATEST DIMENSION. - ASSOCIATED INTERMEDIATE GRADE DUCTAL CARCINOMA IN SITU. - INVASIVE CARCINOMA IS 0.2 CM AWAY FROM POSTERIOR / INFERIOR MARGIN. - OTHER MARGINS ARE NEGATIVE. - SEE ONCOLOGY TEMPLATE. 2. Lymph node, sentinel, biopsy, Right axillary #1 - ONE BENIGN LYMPH NODE WITH NO TUMOR SEEN (0/1). Microscopic Comment 1. BREAST, INVASIVE TUMOR, WITH LYMPH NODE SAMPLING   PREVIOUS RADIATION THERAPY: Yes -she denies history of extra beam radiotherapy but did undergo radioactive iodine treatments as described above for thyroid cancer  PAST MEDICAL HISTORY:  has a past medical history of Diabetes mellitus; Hyperlipidemia; Hypertension; Chronic kidney disease; Kidney stones; Allergy; Pancreatic cancer; Primary pancreatic neuroendocrine tumor (2013); Blood transfusion without reported diagnosis; Heart murmur; Thyroid disease (01/2002); PONV (postoperative nausea and vomiting); Hypothyroidism; Cyst (solitary) of breast; Simple cyst of kidney; Incisional hernia; Anemia; Depression; Status post chemotherapy; Kidney failure; Breast cancer; Sarcoma; and Hernia.    PAST SURGICAL HISTORY: Past Surgical History  Procedure Laterality Date  . Buttock sarcoma  2000  . 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  Abdominal hysterectomy    .  Total knee arthroplasty      left  . Lipoma excision  04/20/2011    left arm  . Cholecystectomy    . Spleenectomy    . Breast surgery    . Urethral stricture dilatation  [id number]  . Pressure ulcer buttock  2005    from position during knee surgery   . Breast lumpectomy with needle localization and axillary sentinel lymph node bx Right 05/30/2013    Procedure: BREAST LUMPECTOMY WITH NEEDLE LOCALIZATION AND AXILLARY SENTINEL LYMPH NODE BX;  Surgeon: Shelly Rubenstein, MD;  Location: [facility] OR;  Service: General;  Laterality: Right;    FAMILY HISTORY: family history includes Cancer in her father and maternal grandmother; Diabetes in her father; Kidney disease in her father.  SOCIAL HISTORY:  reports that she has never smoked. She has never used smokeless tobacco. She reports that she does not drink alcohol or use illicit drugs.  ALLERGIES: Metoclopramide hcl and Ramipril  MEDICATIONS:  Current Outpatient Prescriptions  Medication Sig Dispense Refill  . amLODipine (NORVASC) 5 MG tablet Take 10 mg by mouth daily.       Marland Kitchen atenolol (TENORMIN) 25 MG tablet Take 25 mg by mouth daily.        . BD PEN NEEDLE NANO U/F 32G X 4 MM MISC       . calcium carbonate (TUMS EX) 750 MG chewable tablet Chew 1 tablet by mouth 2 (two) times daily.      . furosemide (LASIX) 40 MG tablet Take 20 mg by mouth as needed.       . insulin aspart (NOVOLOG) 100 UNIT/ML injection Inject 3 Units into the skin 2 (two) times daily. Takes before breakfast and lunch.      . insulin glargine (LANTUS) 100 UNIT/ML injection Inject 22 Units into the skin daily before supper.      . lamoTRIgine (LAMICTAL) 200 MG tablet Take 200 mg by mouth daily.       Marland Kitchen levothyroxine (SYNTHROID, LEVOTHROID) 150 MCG tablet Take 150 mcg by mouth daily.        Marland Kitchen loratadine (CLARITIN) 10 MG tablet Take 10 mg by mouth daily.        . pravastatin (PRAVACHOL) 80 MG tablet Take 80 mg by mouth daily.        Marland Kitchen zolpidem (AMBIEN) 10 MG tablet Take 5 mg by  mouth at bedtime as needed for sleep.       No current facility-administered medications for this encounter.    REVIEW OF SYSTEMS:  Notable for that above.   PHYSICAL EXAM:  height is 5\' 3"  (1.6 m) and weight is 161 lb 12.8 oz (73.392 kg). Her temperature is 98.2 F (36.8 C). Her blood pressure is 151/69 and her pulse is 59.   General: Alert and oriented, in no acute distress HEENT: Head is normocephalic. Pupils are equally round and reactive to light. Extraocular movements are intact. Oropharynx is clear. Neck: Neck is supple, no palpable cervical or supraclavicular lymphadenopathy. Heart: Regular in rate and rhythm with no murmurs, rubs, or gallops. Chest: Clear to auscultation bilaterally, with no rhonchi, wheezes, or rales. Abdomen: Soft, nontender, nondistended, with no rigidity or guarding. Midline vertical surgical scar, well-healed Extremities: No cyanosis or edema. Lymphatics: No concerning lymphadenopathy. Skin: No concerning lesions. Musculoskeletal: symmetric strength and muscle tone throughout. Neurologic: Cranial nerves II through XII are grossly intact. No obvious focalities. Speech is fluent. Coordination is intact. Psychiatric: Judgment and insight are  intact. Affect is appropriate. Breasts: Good evidence of healing from right breast surgery. No palpable lesions in either breast nor any palpable axillary adenopathy bilaterally   ECOG = 1  LABORATORY DATA:  Lab Results  Component Value Date   WBC 6.9 05/23/2013   HGB 12.2 05/23/2013   HCT 37.2 05/23/2013   MCV 95.4 05/23/2013   PLT 315 05/23/2013   CMP     Component Value Date/Time   NA 137 05/23/2013 1242   K 4.0 05/23/2013 1242   CL 99 05/23/2013 1242   CO2 24 05/23/2013 1242   GLUCOSE 182* 05/23/2013 1242   BUN 50* 05/23/2013 1242   CREATININE 2.09* 05/23/2013 1242   CALCIUM 8.5 05/23/2013 1242   PROT 6.8 10/16/2011 0333   ALBUMIN 3.4* 10/16/2011 0333   AST 53* 10/16/2011 0333   ALT 67* 10/16/2011 0333   ALKPHOS  133* 10/16/2011 0333   BILITOT 0.2* 10/16/2011 0333   GFRNONAA 23* 05/23/2013 1242   GFRAA 27* 05/23/2013 1242         RADIOGRAPHY: Dg Chest 2 View  05/23/2013   *RADIOLOGY REPORT*  Clinical Data: Diabetes, hypertension.  CHEST - 2 VIEW  Comparison: None  Findings:  There is normal cardiac contours. Probable calcified mediastinal lymph node.  Mild tortuosity of the thoracic aorta.  No consolidative pulmonary opacities.  Minimal left basilar atelectasis.  Elevation of the left hemidiaphragm. Regional skeleton is unremarkable.  IMPRESSION: No acute cardiopulmonary process.   Original Report Authenticated By: Annia Belt, M.D   Nm Sentinel Node Inj-no Rpt (breast)  05/30/2013   CLINICAL DATA: right breast cancer   Sulfur colloid was injected intradermally by the nuclear medicine  technologist for breast cancer sentinel node localization.    Mm Rt Plc Breast Loc Dev   1st Lesion  Inc Mammo Guide  05/30/2013   *RADIOLOGY REPORT*  Clinical Data: Recent diagnosis of right breast cancer, pathology invasive ductal carcinoma and DCIS.  NEEDLE LOCALIZATION WITH MAMMOGRAPHIC GUIDANCE AND SPECIMEN RADIOGRAPH  Comparison:  Previous exam(s).  Patient presents for needle localization prior to right breast lumpectomy. I met with the patient and we discussed the procedure of needle localization including benefits and alternatives. We discussed the high likelihood of a successful procedure. We discussed the risks of the procedure, including infection, bleeding, tissue injury, and further surgery. Informed, written consent was given. The usual time-out protocol was performed immediately prior to the procedure.  Using mammographic guidance, sterile technique, 2% lidocaine and a 7 cm modified Kopans needle, the barrel shaped tissue marker clip in the upper periareolar right breast was localized using a superior approach.  The films are marked for Dr. Magnus Ivan.  Specimen radiograph was performed at [facility] and confirms that  the tissue marker clip and the adjacent mass are present in the tissue sample.  The specimen is marked for pathology.  IMPRESSION: Needle localization right breast.  No apparent complications.   Original Report Authenticated By: Hulan Saas, M.D.      IMPRESSION/PLAN: This a lovely 67 year old woman with a new diagnosis of T1c N0 M0 stage I right breast cancer as described above. She has a history of other malignancies as described above.  I did offer her genetics referral but they're fairly adamant that they do not want to pursue genetics referral as their son does not wish to have this type of information for him and his children. If they change their mind they will let one of their oncologists know.  It was a pleasure  meeting the patient today. We discussed the risks, benefits, and side effects of radiotherapy. We discussed that radiation would take approximately 4-6 weeks to complete (I think there is a good chance that she could be treated with hypofractionation which would be approximately 4 weeks). We spoke about acute effects including skin irritation and fatigue as well as much less common late effects including heart irritation. We spoke about the cosmetic implications of radiotherapy. We spoke about the latest technology that is used to minimize the risk of late effects for breast cancer patients undergoing radiotherapy. I estimated that over the next 10 years, if she takes an antiestrogen pill, but does not receive radiotherapy, her risk of a local recurrence the right breast would be approximately 15%. This could be lowered to 5% if she receives radiotherapy. I acknowledge that her prognosis is relatively good in light of the fact that she has a early stage breast cancer and it is hormone receptor positive. I did touch upon data from Dannial Monarch al. that was published in the [place] J of Med regarding elderly patients with early stage estrogen receptor positive breast cancers. She  understands that she doesn't technically fit into the criteria since she is less than 108 years old. However, I told her that it's unlikely that radiotherapy would carry any overall survival benefit - the main benefit would be a modest improvement in her chances of local control. The risks benefits and side effects of chemotherapy were not quantified for her and I will defer to medical oncology on that. Understandably she is hesitant to consider chemotherapy in light of difficulty tolerating Gemzar and renal issues from such. No guarantees of treatment were given.  She and her husband are concerned about the potential side effects of both chemotherapy and radiation would like to think about her options before deciding whether or not to pursue any adjuvant therapies. I think we had a productive conversation to help her make her decision. She understands that we'll not schedule her for any followup or treatment planning until she calls back to state her wishes. Consent form was signed today in case she decides to pursue radiotherapy.  I am scheduled to start maternity leave in early November, sooner if necessary.  I let the patient know that once I am on leave, one of my partners can continue their care. They are comfortable with this plan.  I spent 60 minutes  face to face with the patient and more than 50% of that time was spent in counseling and/or coordination of care.    __________________________________________   Lonie Peak, MD

## 2013-06-21 ENCOUNTER — Encounter: Payer: Self-pay | Admitting: Radiation Oncology

## 2013-06-21 DIAGNOSIS — C50119 Malignant neoplasm of central portion of unspecified female breast: Secondary | ICD-10-CM | POA: Insufficient documentation

## 2013-06-21 NOTE — Addendum Note (Signed)
Encounter addended by: Delynn Flavin, RN on: 06/21/2013  6:43 PM<BR>     Documentation filed: Charges VN

## 2013-06-24 ENCOUNTER — Ambulatory Visit: Payer: Medicare Other

## 2013-06-24 ENCOUNTER — Telehealth: Payer: Self-pay | Admitting: *Deleted

## 2013-06-24 ENCOUNTER — Ambulatory Visit: Payer: Medicare Other | Admitting: Oncology

## 2013-06-24 ENCOUNTER — Other Ambulatory Visit: Payer: Medicare Other | Admitting: Lab

## 2013-06-24 NOTE — Telephone Encounter (Signed)
Called pt to let her know that Dr. Welton Flakes is out of the office today sick and we need to reschedule her appt.  Rescheduled and confirmed 06/28/13 appt w/ pt.

## 2013-06-27 ENCOUNTER — Other Ambulatory Visit: Payer: Self-pay | Admitting: *Deleted

## 2013-06-27 DIAGNOSIS — C50111 Malignant neoplasm of central portion of right female breast: Secondary | ICD-10-CM

## 2013-06-28 ENCOUNTER — Other Ambulatory Visit (HOSPITAL_BASED_OUTPATIENT_CLINIC_OR_DEPARTMENT_OTHER): Payer: Medicare Other | Admitting: Lab

## 2013-06-28 ENCOUNTER — Ambulatory Visit (HOSPITAL_BASED_OUTPATIENT_CLINIC_OR_DEPARTMENT_OTHER): Payer: Medicare Other | Admitting: Oncology

## 2013-06-28 ENCOUNTER — Encounter: Payer: Self-pay | Admitting: Oncology

## 2013-06-28 ENCOUNTER — Ambulatory Visit: Payer: Medicare Other

## 2013-06-28 VITALS — BP 128/73 | HR 55 | Temp 98.3°F | Resp 18 | Ht 63.0 in | Wt 160.9 lb

## 2013-06-28 DIAGNOSIS — C50919 Malignant neoplasm of unspecified site of unspecified female breast: Secondary | ICD-10-CM

## 2013-06-28 DIAGNOSIS — Z859 Personal history of malignant neoplasm, unspecified: Secondary | ICD-10-CM

## 2013-06-28 DIAGNOSIS — Z170 Estrogen receptor positive status [ER+]: Secondary | ICD-10-CM

## 2013-06-28 DIAGNOSIS — C50911 Malignant neoplasm of unspecified site of right female breast: Secondary | ICD-10-CM

## 2013-06-28 DIAGNOSIS — C50111 Malignant neoplasm of central portion of right female breast: Secondary | ICD-10-CM

## 2013-06-28 LAB — COMPREHENSIVE METABOLIC PANEL (CC13)
AST: 24 U/L (ref 5–34)
Albumin: 3.2 g/dL — ABNORMAL LOW (ref 3.5–5.0)
Anion Gap: 10 mEq/L (ref 3–11)
BUN: 54.1 mg/dL — ABNORMAL HIGH (ref 7.0–26.0)
CO2: 24 mEq/L (ref 22–29)
Calcium: 8.6 mg/dL (ref 8.4–10.4)
Creatinine: 2.1 mg/dL — ABNORMAL HIGH (ref 0.6–1.1)
Glucose: 186 mg/dl — ABNORMAL HIGH (ref 70–140)
Potassium: 5.3 mEq/L — ABNORMAL HIGH (ref 3.5–5.1)
Sodium: 139 mEq/L (ref 136–145)
Total Bilirubin: 0.31 mg/dL (ref 0.20–1.20)
Total Protein: 6.5 g/dL (ref 6.4–8.3)

## 2013-06-28 LAB — CBC WITH DIFFERENTIAL/PLATELET
Basophils Absolute: 0.1 10*3/uL (ref 0.0–0.1)
EOS%: 2.7 % (ref 0.0–7.0)
Eosinophils Absolute: 0.3 10*3/uL (ref 0.0–0.5)
HCT: 36.6 % (ref 34.8–46.6)
HGB: 12.1 g/dL (ref 11.6–15.9)
MCH: 31.7 pg (ref 25.1–34.0)
MCHC: 33.1 g/dL (ref 31.5–36.0)
MCV: 95.7 fL (ref 79.5–101.0)
MONO%: 9.3 % (ref 0.0–14.0)
NEUT#: 6.1 10*3/uL (ref 1.5–6.5)
NEUT%: 63 % (ref 38.4–76.8)
lymph#: 2.4 10*3/uL (ref 0.9–3.3)

## 2013-06-28 NOTE — Progress Notes (Signed)
Checked in new patient with no issues. She has her packet and appt card.

## 2013-06-28 NOTE — Progress Notes (Signed)
Kristi Fuentes [id number] Mar 05, 1946 68 y.o. 06/28/2013 2:19 PM  CC  Michiel Sites, MD [address] [place] [address] Dr. Carman Ching  REASON FOR CONSULTATION:  67 year old female with new diagnosis of breast cancer and multiple other malignancy, here for discussion of treantment options   STAGE:     REFERRING PHYSICIAN: Dr. Carman Ching  HISTORY OF PRESENT ILLNESS:  Kristi Fuentes is a 67 y.o. female.  00 this doesn't 14 had a screening mammogram performed that revealed a mass in June on to have a diagnostic mammogram performed that showed a 1.5 cm mass in the right breast. She had a needle core biopsy performed on 828. 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 Prognostic markers revealed the tumor to be ER +100% PR +30% HER-2/neu positive with a proliferation marker Ki-67 of 19%. She went on to have a lumpectomy with sentinel lymph node biopsy performed on 05/30/2013. The final pathology did reveal a grade 2 invasive ductal carcinoma with associated microcalcifications spanning 1.5 cm and was associated intermediate grade DCIS. Margins were negative one sentinel node was negative for metastatic disease. There was no lymphovascular invasion. Tumor was ER +100% PR +30% HER-2/neu showed amplification with a ratio 2.2 proliferation marker Ki-67 was 19%. Pathologic staging T1 C. N0 clinical stage I.patient is now seen in medical oncology for discussion of treatment options. She is accompanied by her husband who has done a lot of research on HER-2 positive breast cancer she herself is without any complaints.  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 For this patient did receive chemotherapy Gemzar that led to renal insufficiency. She is very  concerned as does her husband about side effects of chemotherapy   Past Medical History: Past Medical History  Diagnosis Date  . Diabetes mellitus   . Hyperlipidemia   . Hypertension   . Chronic kidney disease   . Kidney stones   . Allergy   . Pancreatic cancer     Chemotherapy  . Primary pancreatic neuroendocrine tumor 2013  . Blood transfusion without reported diagnosis   . Heart murmur   . Thyroid disease 01/2002    2 radiaoactive iodine for cancer  . PONV (postoperative nausea and vomiting)   . Hypothyroidism     had thyroid removed 2002  . Cyst (solitary) of breast   . Simple cyst of kidney     unsure of which side maybe on both kidney's  . Incisional hernia   . Anemia   . Depression     takes Lamictal daily  . Status post chemotherapy     pancreatic histrory  . Kidney failure     During chemotherapy for pancreatic cancer  . Breast cancer   . Sarcoma     Right buttock  . Hernia     Abdominal    Past Surgical History: Past Surgical History  Procedure Laterality Date  . Buttock sarcoma  2000  . 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 Abdominal hysterectomy    . Total knee arthroplasty      left  . Lipoma excision  04/20/2011    left arm  . Cholecystectomy    . Spleenectomy    . Breast surgery    . Urethral stricture dilatation  [id number]  .  Pressure ulcer buttock  2005    from position during knee surgery   . Breast lumpectomy with needle localization and axillary sentinel lymph node bx Right 05/30/2013    Procedure: BREAST LUMPECTOMY WITH NEEDLE LOCALIZATION AND AXILLARY SENTINEL LYMPH NODE BX;  Surgeon: Shelly Rubenstein, MD;  Location: [facility] OR;  Service: General;  Laterality: Right;    Family History: Family History  Problem Relation Age of Onset  . Diabetes Father   . Kidney disease Father   . Cancer Father     colon  . Cancer Maternal Grandmother     breast    Social History History  Substance Use Topics  . Smoking  status: Never Smoker   . Smokeless tobacco: Never Used  . Alcohol Use: No    Allergies: Allergies  Allergen Reactions  . Metoclopramide Hcl     Tardive dyskinesia-type symptoms  . Ramipril Cough    Current Medications: Current Outpatient Prescriptions  Medication Sig Dispense Refill  . amLODipine (NORVASC) 5 MG tablet Take 10 mg by mouth daily.       Marland Kitchen atenolol (TENORMIN) 25 MG tablet Take 25 mg by mouth daily.        . BD PEN NEEDLE NANO U/F 32G X 4 MM MISC       . calcium carbonate (TUMS EX) 750 MG chewable tablet Chew 1 tablet by mouth 2 (two) times daily.      . furosemide (LASIX) 40 MG tablet Take 20 mg by mouth as needed.       Marland Kitchen HYDROcodone-acetaminophen (NORCO/VICODIN) 5-325 MG per tablet       . insulin aspart (NOVOLOG) 100 UNIT/ML injection Inject 3 Units into the skin 2 (two) times daily. Takes before breakfast and lunch.      . insulin glargine (LANTUS) 100 UNIT/ML injection Inject 22 Units into the skin daily before supper.      . lamoTRIgine (LAMICTAL) 200 MG tablet Take 200 mg by mouth daily.       Marland Kitchen levothyroxine (SYNTHROID, LEVOTHROID) 150 MCG tablet Take 150 mcg by mouth daily.        Marland Kitchen loratadine (CLARITIN) 10 MG tablet Take 10 mg by mouth daily.        . pravastatin (PRAVACHOL) 80 MG tablet Take 80 mg by mouth daily.        Marland Kitchen zolpidem (AMBIEN) 10 MG tablet Take 5 mg by mouth at bedtime as needed for sleep.       No current facility-administered medications for this visit.    OB/GYN History:menache at 43, menopause at late 39's, G15p1 (2 children - daughter adopted), no HRT  Fertility Discussion: n/a Prior History of Cancer: yes  Health Maintenance:  Colonoscopy uptodate Bone Density 5 years ago Last PAP smear yes  ECOG PERFORMANCE STATUS: 1 - Symptomatic but completely ambulatory  Genetic Counseling/testing: patient defers for now  REVIEW OF SYSTEMS: The 14 point review of system was obtained and scan separately into the electronic medical  record  PHYSICAL EXAMINATION: Blood pressure 128/73, pulse 55, temperature 98.3 F (36.8 C), temperature source Oral, resp. rate 18, height 5\' 3"  (1.6 m), weight 160 lb 14.4 oz (72.984 kg).  NWG:NFAOZ, no distress, well nourished and well developed SKIN: skin color, texture, turgor are normal HEAD: Normocephalic EYES: PERRLA, EOMI EARS: External ears normal OROPHARYNX:no exudate, no erythema and lips, buccal mucosa, and tongue normal  NECK: supple, no adenopathy LYMPH:  no palpable lymphadenopathy BREAST:left breast normal without mass, skin or nipple changes or  axillary nodes, surgical scars noted n the right breast LUNGS: clear to auscultation  HEART: regular rate & rhythm ABDOMEN:abdomen soft, non-tender, normal bowel sounds, no masses or organomegaly and prior surgical scars are noted BACK: Back symmetric, no curvature., No CVA tenderness EXTREMITIES:no edema, no clubbing, no cyanosis  NEURO: alert & oriented x 3 with fluent speech, no focal motor/sensory deficits, gait normal     STUDIES/RESULTS: Nm Sentinel Node Inj-no Rpt (breast)  05/30/2013   CLINICAL DATA: right breast cancer   Sulfur colloid was injected intradermally by the nuclear medicine  technologist for breast cancer sentinel node localization.    Mm Rt Plc Breast Loc Dev   1st Lesion  Inc Mammo Guide  05/30/2013   *RADIOLOGY REPORT*  Clinical Data: Recent diagnosis of right breast cancer, pathology invasive ductal carcinoma and DCIS.  NEEDLE LOCALIZATION WITH MAMMOGRAPHIC GUIDANCE AND SPECIMEN RADIOGRAPH  Comparison:  Previous exam(s).  Patient presents for needle localization prior to right breast lumpectomy. I met with the patient and we discussed the procedure of needle localization including benefits and alternatives. We discussed the high likelihood of a successful procedure. We discussed the risks of the procedure, including infection, bleeding, tissue injury, and further surgery. Informed, written consent was  given. The usual time-out protocol was performed immediately prior to the procedure.  Using mammographic guidance, sterile technique, 2% lidocaine and a 7 cm modified Kopans needle, the barrel shaped tissue marker clip in the upper periareolar right breast was localized using a superior approach.  The films are marked for Dr. Magnus Ivan.  Specimen radiograph was performed at [facility] and confirms that the tissue marker clip and the adjacent mass are present in the tissue sample.  The specimen is marked for pathology.  IMPRESSION: Needle localization right breast.  No apparent complications.   Original Report Authenticated By: Hulan Saas, M.D.     LABS:    Chemistry      Component Value Date/Time   NA 139 06/28/2013 1248   NA 137 05/23/2013 1242   K 5.3* 06/28/2013 1248   K 4.0 05/23/2013 1242   CL 99 05/23/2013 1242   CO2 24 06/28/2013 1248   CO2 24 05/23/2013 1242   BUN 54.1* 06/28/2013 1248   BUN 50* 05/23/2013 1242   CREATININE 2.1* 06/28/2013 1248   CREATININE 2.09* 05/23/2013 1242      Component Value Date/Time   CALCIUM 8.6 06/28/2013 1248   CALCIUM 8.5 05/23/2013 1242   ALKPHOS 97 06/28/2013 1248   ALKPHOS 133* 10/16/2011 0333   AST 24 06/28/2013 1248   AST 53* 10/16/2011 0333   ALT 26 06/28/2013 1248   ALT 67* 10/16/2011 0333   BILITOT 0.31 06/28/2013 1248   BILITOT 0.2* 10/16/2011 0333      Lab Results  Component Value Date   WBC 9.8 06/28/2013   HGB 12.1 06/28/2013   HCT 36.6 06/28/2013   MCV 95.7 06/28/2013   PLT 363 06/28/2013   PATHOLOGY: Diagnosis 1. Breast, lumpectomy, Right - INVASIVE GRADE II DUCTAL CARCINOMA WITH ASSOCIATED MICROCALCIFICATION, SPANNING 1.5 CM IN GREATEST DIMENSION. - ASSOCIATED INTERMEDIATE GRADE DUCTAL CARCINOMA IN SITU. - INVASIVE CARCINOMA IS 0.2 CM AWAY FROM POSTERIOR / INFERIOR MARGIN. - OTHER MARGINS ARE NEGATIVE. - SEE ONCOLOGY TEMPLATE. 2. Lymph node, sentinel, biopsy, Right axillary #1 - ONE BENIGN LYMPH NODE WITH NO TUMOR  SEEN (0/1). Microscopic Comment 1. BREAST, INVASIVE TUMOR, WITH LYMPH NODE SAMPLING Specimen, including laterality and lymph node sampling (sentinel, non-sentinel): Right partial breast with sentinel lymph  node sampling. Procedure: Right breast lumpectomy with right sentinel lymph node biopsy. Histologic type: Ductal carcinoma. Grade: II. Tubule formation: 2. Nuclear pleomorphism: 2. Mitotic: 2. Tumor size (gross measurement): 1.5 cm. Margins: Invasive, distance to closest margin: 0.2 cm to posterior / inferior margin. In-situ, distance to closest margin: At least 0.2 cm from posterior / inferior margin. Lymphovascular invasion: Definitive lymph/vascular invasion is not identified. Ductal carcinoma in situ: Yes. Grade: Intermediate grade. Extensive intraductal component: No. Lobular neoplasia: No. Tumor focality: Unifocal. Treatment effect: N/A. Extent of tumor: Tumor confined to breast parenchyma. Skin: Not involved. 1 of 3 FINAL for Kristi Fuentes, Kristi Fuentes ([id number]) Microscopic Comment(continued) Nipple: Not received. Skeletal muscle: Not received. Lymph nodes: Examined: 1 Sentinel. 0 Non-sentinel. 1 Total. Lymph nodes with metastasis: 0. Breast prognostic profile: Performed on previous case [id number] Estrogen receptor: 100%, positive. Progesterone receptor: 30%, positive. Her 2 neu: 2.20, ratio, shows amplification. Ki-67: 19%. Additional breast finding: Fibrocystic changes with usual ductal hyperplasia, benign skin and fibroadenoma. TNM: pT1c, pN0, MX. Kristi Abts MD Pathologist, Electronic Signature (Case signed 05/31/2013) Specimen Gross and Clinical Information   ASSESSMENT    67 year old female with  1 new diagnosis of right breast cancer measuring 1.5 cm grade 2 invasive ductal with associated microcalcifications and intermediate grade ductal carcinoma type II. One sentinel node was negative for metastatic disease after a lumpectomy and sentinel lymph  node biopsy. Tumor was ER positive PR positive HER-2/neu positive with a proliferation marker Ki-67 of 19%. Patient is now seen in medical oncology for discussion of treatment options.  #2The patient and I and her husband went over her pathology and radiology in detail. We extensively discussed HER-2/neu positivity ER positivity and grade of tumor. Patient has the kidney several references that he is looking at regarding Herceptin and chemotherapy usage. I discussed adjuvant chemotherapy and her chemotherapy with the patient and her husband we discussed the rationale. We discussed the studies that we performed in the adjuvant setting. We discussed different chemotherapy regimens including TCH. We discussed the possibility of modifying patient's regimen to just Taxol and Herceptin to be given weekly x12 weeks and Herceptin every 3 weeks with radiation therapy followed by Herceptin every 3 weeks with antiestrogen therapy to finish out a one-year of her Herceptin and then continue antiestrogen therapy for a total of 5-10 years. We discussed different forms of antiestrogen therapy.  #3 at the end of the discussion it was uncertain whether or not patient wanted to be treated. He did report my conversations. I am hoping that they will discuss the situation with their  And 8 a definitive decision. They do understand that you're to begin the Herceptin and chemotherapy we will need a Port-A-Cath placed, we will need to therapy teaching class, we will also need an echocardiogram. And of course follow these do take time. She is approximately 4 weeks out since her surgery. We discussed the rationale for trying to proceed with chemotherapy as soon as possible.  Clinical Trial Eligibility:no Multidisciplinary conference discussionno     PLAN:    #1 we discussed use of adjuvant Herceptin and chemotherapy.  #2 however patient and her husband could not decide whether they want to proceed with treatment here or the like  a second opinion. They are gone to call me and let me know their final decision.        Discussion: Patient is being treated per NCCN breast cancer care guidelines appropriate for stage.I   Thank you so much for allowing me to  participate in the care of Kristi Fuentes. I will continue to follow up the patient with you and assist in her care.  All questions were answered. The patient knows to call the clinic with any problems, questions or concerns. We can certainly see the patient much sooner if necessary.  I spent 110 minutes counseling the patient face to face. The total time spent in the appointment was 110 minutes.  Drue Second, MD Medical/Oncology [facility] [phone] (beeper) [phone] (Office)  06/28/2013, 2:19 PM

## 2013-07-08 ENCOUNTER — Encounter: Payer: Self-pay | Admitting: *Deleted

## 2013-07-08 NOTE — Progress Notes (Signed)
Pt called the office asking for a 2nd opinion. This discussion was between the pt, Raiford Noble, and Dr Myna Hidalgo. Per Dr Myna Hidalgo, to send tumor off for OncoType DX to have for discussion during appt made for 07/16/13.Marland Kitchen Request faxed to [facility] Pathology Dept.

## 2013-07-11 ENCOUNTER — Other Ambulatory Visit: Payer: Self-pay

## 2013-07-16 ENCOUNTER — Ambulatory Visit (HOSPITAL_BASED_OUTPATIENT_CLINIC_OR_DEPARTMENT_OTHER): Payer: Medicare Other | Admitting: Hematology & Oncology

## 2013-07-16 VITALS — BP 144/66 | HR 55 | Temp 98.3°F | Resp 14 | Ht 61.0 in | Wt 160.0 lb

## 2013-07-16 DIAGNOSIS — C50119 Malignant neoplasm of central portion of unspecified female breast: Secondary | ICD-10-CM

## 2013-07-16 DIAGNOSIS — C50111 Malignant neoplasm of central portion of right female breast: Secondary | ICD-10-CM

## 2013-07-18 MED ORDER — TAMOXIFEN CITRATE 20 MG PO TABS: 20.0000 mg | ORAL_TABLET | Freq: Every day | ORAL | Status: DC

## 2013-07-18 NOTE — Progress Notes (Signed)
This office note has been dictated.

## 2013-07-19 ENCOUNTER — Telehealth: Payer: Self-pay | Admitting: Hematology & Oncology

## 2013-07-19 ENCOUNTER — Encounter: Payer: Self-pay | Admitting: Hematology & Oncology

## 2013-07-19 NOTE — Telephone Encounter (Signed)
Pt aware of 11-19 Echo, 12-3 and 24 th appointments. Per RN JoEllen she doesn't need chemo education.

## 2013-07-19 NOTE — Telephone Encounter (Signed)
Left message for pt to call I have several appointments I need to give her and one for next week.

## 2013-07-23 ENCOUNTER — Other Ambulatory Visit: Payer: Medicare Other

## 2013-07-24 ENCOUNTER — Ambulatory Visit (HOSPITAL_BASED_OUTPATIENT_CLINIC_OR_DEPARTMENT_OTHER)
Admission: RE | Admit: 2013-07-24 | Discharge: 2013-07-24 | Disposition: A | Discharge status: Home / Self Care | Payer: Medicare Other | Source: Ambulatory Visit | Attending: Hematology & Oncology | Admitting: Hematology & Oncology

## 2013-07-24 DIAGNOSIS — E119 Type 2 diabetes mellitus without complications: Secondary | ICD-10-CM | POA: Insufficient documentation

## 2013-07-24 DIAGNOSIS — Z01810 Encounter for preprocedural cardiovascular examination: Secondary | ICD-10-CM | POA: Insufficient documentation

## 2013-07-24 DIAGNOSIS — I517 Cardiomegaly: Secondary | ICD-10-CM

## 2013-07-24 DIAGNOSIS — I10 Essential (primary) hypertension: Secondary | ICD-10-CM | POA: Insufficient documentation

## 2013-07-24 DIAGNOSIS — C50919 Malignant neoplasm of unspecified site of unspecified female breast: Secondary | ICD-10-CM | POA: Insufficient documentation

## 2013-07-24 DIAGNOSIS — C50111 Malignant neoplasm of central portion of right female breast: Secondary | ICD-10-CM

## 2013-07-24 NOTE — Progress Notes (Signed)
Echocardiogram 2D Echocardiogram has been performed.  Laticha Ferrucci 07/24/2013, 12:59 PM

## 2013-07-25 ENCOUNTER — Encounter: Payer: Self-pay | Admitting: *Deleted

## 2013-07-27 NOTE — Progress Notes (Signed)
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  INTERIM HISTORY:  Ms. Knueppel comes in for a second opinion.  This is a 67- year-old white female.  She has been in decent health.  She does have diabetes.  She is on insulin.  She has had problems with hypertension. She has a past history of localized pancreatic cancer.  I think this was stage I.  She underwent a Whipple procedure at [facility].  She got adjuvant therapy and apparently had a lot of problems with this.  I think she developed kidney issues from this.  She was found, I think, to have a routine mammogram that was abnormal. She was found to have a mass in the right breast.  A biopsy was performed.  This showed this to be a ductal carcinoma.  She had ER/PR positivity.  She was also HER2 positive.  She underwent a lumpectomy with axillary node dissection on 09/25.  The path report ([id number]) showed invasive ductal carcinoma.  This measured 1.5 cm.  It was histologic grade 2.  Margins were clear.  There was no lymphovascular space invasion.  She had a negative sentinel lymph node.  She initially was seen by Dr. Welton Flakes.  Dr. Welton Flakes recommended adjuvant chemotherapy with Herceptin.  This would probably considered a standard of care for triple-positive early-stage breast cancer.  However, the patient and her husband are having an incredibly hard time thinking that she needs chemotherapy.  They decided to have a second opinion.  She came out to see [place].  I went ahead and got an Oncotype assay on her. Her score, surprisingly, was low at 15.  This would give her a 10-year risk of recurrence with tamoxifen alone of 10%.  I talked to Ms. Bucks and her husband at length.  I again told them that the standard of care currently is chemotherapy with Herceptin.  The Herceptin would be for a year.  I did tell them that there was a clinical trial going on at [facility] that randomized triple-positive women with  early-stage breast cancer to Herceptin and endocrine therapy versus Herceptin plus chemotherapy with endocrine therapy.  She would not qualify for this because of her other malignancy which was just this year.  Ms. Branagan looks pretty good.  I would say her performance status is ECOG 1.  EXAM:  General:  This is a fairly well-developed, well-nourished white female in no obvious distress.  Vital Signs:  Show a temperature of 98.3, pulse 55, respiratory rate 18, blood pressure 144/66, weight is 160 pounds.  Head and Neck:  Exam shows a normocephalic, atraumatic skull.  She has no ocular or oral lesions.  She has no palpable cervical or supraclavicular lymph nodes.  Lungs:  Clear bilaterally.  Cardiac: Regular rate and rhythm with a normal S1 and S2.  There are no murmurs, rubs, or bruits.  Breasts:  Exam shows left breast with no masses, edema, or erythema.  There is no left axillary adenopathy.  Right breast shows a healing lumpectomy.  This is at about the 10 o'clock position. There is some slight scarring, some slight ecchymosis at the lumpectomy site.  There is no obvious right axillary lymph node.  Abdomen:  Soft. She has a well-healed laparotomy scar.  There is no fluid wave.  There is no palpable hepatosplenomegaly.  Back:  No tenderness over the spine, ribs, or hips.  Extremities:  Show no clubbing, cyanosis, or edema.  She has good strength in  her arms and legs.  Neurological:  Exam shows no focal neurological deficits.  LABS:  Not done this visit.  IMPRESSION:  Ms. Rincon is a very nice 67 year old white female.  She has an incredible history of recent pancreatic cancer.  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  I think she has been tested for genetic syndromes, but I think she says everything has come back negative.  Again, I talked to her and her husband for over an hour.  They just will not take chemotherapy.  She will take Herceptin.  She also will take  endocrine therapy.  I tried to get her to try Femara.  However, her husband comes up with literature stating that aromatase inhibitors could affect her depression and also affect her diabetes.  This certainly would be a very rare circumstance.  I think he really wants her to be on tamoxifen.  I told him that Femara compared head-to-head with tamoxifen wins.  I told him that with tamoxifen, there is a risk of blood clots that she has to watch out for.  I think that she may be at a higher risk of blood clots, given her past history.  She has had a hysterectomy, so they do not have to worry about uterine cancer.  I spoke to them about Herceptin.  I told them that she would need Herceptin for 1 year.  It would be given every 3 weeks.  She agrees to this.  I told her and her husband that she needs radiation treatments.  She had a lumpectomy.  Again, the standard of care is radiation after a lumpectomy.  I think that her husband is very worried about her having radiation.  I told them to at least talk to the radiation oncologist about their concerns and let him or her talk to her about the benefits of radiation therapy.  I wanted Mr. and Ms. Abe to know and understand fully well that what they are deciding is not considered the standard of care, and they do accept the risk of her cancer coming back at a higher rate.  They realize that if her cancer comes back, that she likely will not be cured.  She could still be treated.  They both fully recognize this risk and accept it.  She would like to be treated out at the [facility]. It is only 5 minutes from her home.  She really liked our office.  We did have a good conversation.  We will get an echocardiogram on her.  We will try to get the Herceptin started in the next few weeks.  I will start her on tamoxifen, as I just do not think that her husband will let me start her on Femara.  He realizes and she realizes that  she will need 10 years of adjuvant hormonal therapy and not 5.  Hopefully after 5 years of tamoxifen, they will let us switch her over to an aromatase inhibitor.  If not, then she will have 10 years of tamoxifen.  I will plan to get her back to see her when she comes in for a second cycle of Herceptin.  If she does undergo radiation therapy, then we can always stop the tamoxifen.  We will continue her on the Herceptin.  Again, I spent about an hour and a half with them today.  This was a spirited meeting.  We did share our faith, and I did give her  a prayer blanket.    ______________________________ Josph Macho, M.D. PRE/MEDQ  D:  07/18/2013  T:  07/27/2013  Job:  [id number]

## 2013-07-29 ENCOUNTER — Encounter (HOSPITAL_COMMUNITY): Payer: Self-pay

## 2013-07-30 NOTE — Progress Notes (Signed)
Kristi Flavin, RN (Registered Nurse)     Related Notes: Original Note by Kristi Flavin, RN (Registered Nurse) filed at 06/19/2013 9:01 AM    Location of Breast Cancer: Right Breast , Follow up New Consult Histology per Pathology Report:  05/30/13  Diagnosis 1. Breast, lumpectomy, Right - INVASIVE GRADE II DUCTAL CARCINOMA WITH ASSOCIATED MICROCALCIFICATION, SPANNING 1.5 CM IN GREATEST DIMENSION.  - ASSOCIATED INTERMEDIATE GRADE DUCTAL CARCINOMA IN SITE. - INVASIVE CARCINOMA IS 0.2 CM AWAY FROM POSTERIOR / INFERIOR MARGIN.  OTHER MARGINS ARE NEGATIV- SEE ONCOLOGY TEMPLATE. 2. Lymph node, sentinel, biopsy, Right axillary #1 - ONE BENIGN LYMPH NODE WITH NO TUMOR SEEN (0/1).    Receptor Status: ER(100%), PR (30%), Her2-neu (No amplification), Ki-67(19%)   Did patient present with symptoms (if so, please note symptoms) or was this found on screening mammography?: Found on mammogram   Past/Anticipated interventions by surgeon, if any: 05/30/13 Lumpectomy Right Breast   Past/Anticipated interventions by medical oncology, if any: Chemotherapy : saw Dr. Myna Hidalgo 2nd opinion 07/29/13, to start Herceptin  And Tamoxifen , Oncotype DX=15 07/15/13  Lymphedema issues, if any: None  Pain issues, if any: NO  Menarche age 6, parity age 58, G75, P43, adopted daughters, BC x 3-4 years, No HRT  SAFETY ISSUES:  Prior radiation? No - Had thyroid cancer and had 2 radioactive iodine treatments at [facility] in 2005 and 2007 Pacemaker/ICD? No  Possible current pregnancy?No  Is the patient on methotrexate? No Current Complaints / other details: Completed chemotherapy with Gemzar at [facility] for pancreatic cancer. 20 % of pancreas left. Did not receive all chemotherapy due to renal failure and herGFR is is the mid 20's  Has 2 children.  Started Tamoxifen 2 weeks ago.  She has not had any side effects from it.  She starts Herceptin on Wednesday.        Kristi Petties, RN 07/30/2013,11:34 AM

## 2013-07-31 ENCOUNTER — Ambulatory Visit
Admission: RE | Admit: 2013-07-31 | Discharge: 2013-07-31 | Disposition: A | Discharge status: Home / Self Care | Payer: Medicare Other | Source: Ambulatory Visit | Attending: Radiation Oncology | Admitting: Radiation Oncology

## 2013-07-31 ENCOUNTER — Encounter: Payer: Self-pay | Admitting: Radiation Oncology

## 2013-07-31 VITALS — BP 162/67 | HR 64 | Temp 97.6°F | Ht 61.5 in | Wt 164.3 lb

## 2013-07-31 DIAGNOSIS — C259 Malignant neoplasm of pancreas, unspecified: Secondary | ICD-10-CM | POA: Insufficient documentation

## 2013-07-31 DIAGNOSIS — C50111 Malignant neoplasm of central portion of right female breast: Secondary | ICD-10-CM

## 2013-07-31 DIAGNOSIS — R5381 Other malaise: Secondary | ICD-10-CM | POA: Insufficient documentation

## 2013-07-31 DIAGNOSIS — C50919 Malignant neoplasm of unspecified site of unspecified female breast: Secondary | ICD-10-CM | POA: Insufficient documentation

## 2013-07-31 NOTE — Progress Notes (Signed)
Please see the Nurse Progress Note in the MD Initial Consult Encounter for this patient. 

## 2013-07-31 NOTE — Progress Notes (Signed)
Radiation Oncology         [phone] ________________________________  Name: Kristi Fuentes MRN: [id number]  Date: 07/31/2013  DOB: Dec 07, 1945  Follow-Up Visit Note  CC: Michiel Sites, MD  Josph Macho, MD  Diagnosis:   pT1cN0M0 Grade II IDC, right breast, triple positive  Narrative:  The patient returns today for for further evaluation. Patient was initially seen for consultation in radiation oncology by Dr. Basilio Cairo on October 15. Since that time the patient sought out a second opinion concerning recommendations for adjuvant chemotherapy. She did see Dr. Arlan Organ and has decided to transfer her medical oncology care to the [facility] facility. The patient has tentatively agreed to proceed with adjuvant hormonal therapy and Herceptin as part of her management. She comes in today to further discuss adjuvant radiation therapy as part of her overall management.      She continues to be significantly fatigued  after all she's been through with her multiple malignancies. Her last diagnosis with pancreatic cancer and treatment has caused significant stress for her medically and psychologically.           She denies any discomfort in the right breast nipple discharge or bleeding.                       ALLERGIES:  is allergic to metoclopramide hcl and ramipril.  Meds: Current Outpatient Prescriptions  Medication Sig Dispense Refill  . amLODipine (NORVASC) 5 MG tablet Take 10 mg by mouth daily.       Marland Kitchen atenolol (TENORMIN) 25 MG tablet Take 25 mg by mouth daily.        . BD PEN NEEDLE NANO U/F 32G X 4 MM MISC       . calcium carbonate (TUMS EX) 750 MG chewable tablet Chew 1 tablet by mouth 2 (two) times daily.      . furosemide (LASIX) 40 MG tablet Take 20 mg by mouth as needed.       . insulin aspart (NOVOLOG) 100 UNIT/ML injection Inject 3 Units into the skin 2 (two) times daily. Takes before breakfast and lunch.      . insulin glargine (LANTUS) 100 UNIT/ML injection Inject 22  Units into the skin daily before supper.      . lamoTRIgine (LAMICTAL) 200 MG tablet Take 200 mg by mouth daily.       Marland Kitchen levothyroxine (SYNTHROID, LEVOTHROID) 150 MCG tablet Take 150 mcg by mouth daily.        Marland Kitchen loratadine (CLARITIN) 10 MG tablet Take 10 mg by mouth daily.        . pravastatin (PRAVACHOL) 80 MG tablet Take 80 mg by mouth daily.        . tamoxifen (NOLVADEX) 20 MG tablet Take 1 tablet (20 mg total) by mouth daily.  90 tablet  3  . zolpidem (AMBIEN) 10 MG tablet Take 5 mg by mouth at bedtime as needed for sleep.       No current facility-administered medications for this encounter.    Physical Findings: The patient is in no acute distress. Patient is alert and oriented.  height is 5' 1.5" (1.562 m) and weight is 164 lb 4.8 oz (74.526 kg). Her temperature is 97.6 F (36.4 C). Her blood pressure is 162/67 and her pulse is 64. .  A detailed physical examination was not performed today.  Lab Findings: Lab Results  Component Value Date   WBC 9.8 06/28/2013   HGB 12.1 06/28/2013  HCT 36.6 06/28/2013   MCV 95.7 06/28/2013   PLT 363 06/28/2013      Radiographic Findings: No results found.  Impression: pT1cN0M0 Grade II IDC, right breast, triple positive.  Today we discussed the benefit of radiation therapy as part of breast conservation therapy in detail. The patient and her husband were quite inquisitive concerning treatment and the husband had done significant literature review  as it relates to early stage hormone receptor positive breast cancer in older women.   The patient and her husband understand that radiation would provide a benefit in terms of risk for local recurrence but would likely not provide a survival benefit. After careful consideration the patient has decided not to proceed with radiation therapy as part of her management but at this point will proceed with adjuvant Herceptin and hormonal therapy. The patient will continue close followup with medical  oncology and therefore followup in radiation oncology is not scheduled. I would however be glad to discuss her management at a later date in person or over the phone if she should have additional questions.                        Plan:  When necessary followup in radiation oncology.  _____________________________________ -----------------------------------  Billie Lade, PhD, MD

## 2013-08-05 ENCOUNTER — Telehealth: Payer: Self-pay | Admitting: Hematology & Oncology

## 2013-08-05 NOTE — Telephone Encounter (Signed)
Pt cx 12-3 chemo said sent MD e-mail, I cancelled appt. And transferred to RN

## 2013-08-07 ENCOUNTER — Ambulatory Visit: Payer: Medicare Other

## 2013-08-16 ENCOUNTER — Telehealth: Payer: Self-pay | Admitting: Hematology & Oncology

## 2013-08-16 NOTE — Telephone Encounter (Signed)
Pt called cx 12-24 lab and tx but kept MD appointment. Amy RN aware

## 2013-08-28 ENCOUNTER — Ambulatory Visit (HOSPITAL_BASED_OUTPATIENT_CLINIC_OR_DEPARTMENT_OTHER): Payer: Medicare Other | Admitting: Hematology & Oncology

## 2013-08-28 ENCOUNTER — Other Ambulatory Visit: Payer: Medicare Other | Admitting: Lab

## 2013-08-28 ENCOUNTER — Other Ambulatory Visit (HOSPITAL_BASED_OUTPATIENT_CLINIC_OR_DEPARTMENT_OTHER): Payer: Medicare Other

## 2013-08-28 ENCOUNTER — Ambulatory Visit: Payer: Medicare Other

## 2013-08-28 VITALS — BP 166/64 | HR 72 | Temp 97.8°F | Ht 61.0 in | Wt 166.1 lb

## 2013-08-28 DIAGNOSIS — E119 Type 2 diabetes mellitus without complications: Secondary | ICD-10-CM

## 2013-08-28 DIAGNOSIS — Z170 Estrogen receptor positive status [ER+]: Secondary | ICD-10-CM

## 2013-08-28 DIAGNOSIS — C50919 Malignant neoplasm of unspecified site of unspecified female breast: Secondary | ICD-10-CM

## 2013-08-28 DIAGNOSIS — C50911 Malignant neoplasm of unspecified site of right female breast: Secondary | ICD-10-CM

## 2013-08-28 DIAGNOSIS — C259 Malignant neoplasm of pancreas, unspecified: Secondary | ICD-10-CM

## 2013-08-28 LAB — CBC WITH DIFFERENTIAL (CANCER CENTER ONLY)
BASO#: 0 10*3/uL (ref 0.0–0.2)
Eosinophils Absolute: 0.1 10*3/uL (ref 0.0–0.5)
HGB: 11.8 g/dL (ref 11.6–15.9)
LYMPH#: 1.7 10*3/uL (ref 0.9–3.3)
LYMPH%: 24.1 % (ref 14.0–48.0)
MONO#: 0.7 10*3/uL (ref 0.1–0.9)
MONO%: 9.3 % (ref 0.0–13.0)
NEUT#: 4.5 10*3/uL (ref 1.5–6.5)
Platelets: 344 10*3/uL (ref 145–400)
RBC: 3.69 10*6/uL — ABNORMAL LOW (ref 3.70–5.32)
WBC: 7 10*3/uL (ref 3.9–10.0)

## 2013-08-28 NOTE — Progress Notes (Signed)
DIAGNOSES: 1. Stage IB (T1b, N0, M0) ductal carcinoma of the right breast-triple     positive. 2. A localized pancreatic cancer.  CURRENT THERAPY:  Tamoxifen 20 mg p.o. daily.  INTERIM HISTORY:  Kristi Fuentes comes in for a second office visit.  We saw her back in November.  At that point in time, we recommended that she undergo aromatase inhibitor therapy along with Herceptin.  Also, radiation therapy was advised.  She has declined all therapeutic recommendations.  She is on tamoxifen. She feels that this is the best thing for her.  I certainly respect this.  She is doing okay on the tamoxifen right now.  She has had no problems with bony pain.  Her appetite has been good.  She has had no fatigue or weakness.  She has diabetes.  Her blood sugars have been marginally controlled according to her own admission.  She has had no change in bowel or bladder habits.  She has had no fever. She has had no leg swelling.  She has had no headache.  PHYSICAL EXAMINATION:  General:  This is a well-developed, well- nourished white female, in no obvious distress.  Vital Signs: Temperature of 97.8, pulse 72, respiratory rate 18, blood pressure 166/64.  Weight is 166 pounds.  Head and Neck:  Normocephalic, atraumatic skull.  There are no ocular or oral lesions.  There are no palpable cervical or supraclavicular lymph nodes.  Lungs:  Clear bilaterally.  Cardiac:  Regular rate and rhythm with a normal S1 and S2. She has a 2/6 systolic ejection murmur.  Abdomen:  Soft.  She has good bowel sounds.  She has no fluid wave.  She has well-healed laparotomy scar.  There is no palpable hepatosplenomegaly.  Breasts:  Left breast with no masses, edema, or erythema.  There is no left axillary adenopathy.  Right breast shows well-healed lumpectomy at the 12 o'clock position.  There is no distinct mass in the right breast.  There is no right axillary adenopathy.  Back:  No tenderness over the spine, ribs, or hips.   Extremities:  No clubbing, cyanosis, or edema.  Neurological: No focal neurological deficits.  LABORATORY STUDIES:  All are pending.  IMPRESSION:  Kristi Fuentes is a very charming 67 year old white female with history of stage IB ductal carcinoma of the right breast.  She underwent lumpectomy.  She is triple positive.  Her Oncotype score is 15.  She, again, declines standard therapy.  She and her husband both have done a lot of research.  Again, she feels very confident that tamoxifen, by itself, is the best treatment for her.  She has spoken with Radiation Oncology.  They recommended radiation therapy, but again she does not wish to undertake this.  For now, we will plan to get her back in another 2 or 3 months.  I spent a good hour with him today.  I again went  over the standard recommendations that we have for triple positive breast cancer.  She is very confident and comfortable with her decision.    ______________________________ Josph Macho, M.D. PRE/MEDQ  D:  08/28/2013  T:  08/28/2013  Job:  [id number]

## 2013-08-28 NOTE — Progress Notes (Signed)
This office note has been dictated.

## 2013-08-29 LAB — COMPREHENSIVE METABOLIC PANEL
BUN: 54 mg/dL — ABNORMAL HIGH (ref 6–23)
CO2: 24 mEq/L (ref 19–32)
Calcium: 8 mg/dL — ABNORMAL LOW (ref 8.4–10.5)
Chloride: 104 mEq/L (ref 96–112)
Creatinine, Ser: 2.21 mg/dL — ABNORMAL HIGH (ref 0.50–1.10)
Glucose, Bld: 299 mg/dL — ABNORMAL HIGH (ref 70–99)
Total Bilirubin: 0.3 mg/dL (ref 0.3–1.2)

## 2013-08-29 LAB — MAGNESIUM: Magnesium: 2.4 mg/dL (ref 1.5–2.5)

## 2013-08-29 LAB — CANCER ANTIGEN 19-9: CA 19-9: 24.2 U/mL (ref <35.0)

## 2013-08-30 ENCOUNTER — Telehealth: Payer: Self-pay | Admitting: Hematology & Oncology

## 2013-08-30 NOTE — Telephone Encounter (Signed)
Pt called cx 1-14 is aware of 2-4 MD appointment. RN will let MD know

## 2013-09-16 ENCOUNTER — Ambulatory Visit (INDEPENDENT_AMBULATORY_CARE_PROVIDER_SITE_OTHER): Payer: Medicare Other | Admitting: Surgery

## 2013-09-16 ENCOUNTER — Encounter (INDEPENDENT_AMBULATORY_CARE_PROVIDER_SITE_OTHER): Payer: Self-pay | Admitting: Surgery

## 2013-09-16 VITALS — BP 150/88 | HR 60 | Temp 99.1°F | Resp 14 | Ht 61.5 in | Wt 165.4 lb

## 2013-09-16 DIAGNOSIS — Z853 Personal history of malignant neoplasm of breast: Secondary | ICD-10-CM

## 2013-09-16 NOTE — Progress Notes (Signed)
Subjective:     Patient ID: Kristi Fuentes, female   DOB: 09-Aug-1946, 68 y.o.   MRN: [id number]  HPI She is here for long-term followup of her right breast cancer. She is almost 4 months status post needle localized right breast lumpectomy. She has seen the oncologist and is refusing any treatment other than tamoxifen. She is doing well today he has no complaints.  Review of Systems     Objective:   Physical Exam On exam, her incision is well healed on the right breast. There are no masses and no adenopathy    Assessment:     Patient stable with a history of right breast cancer     Plan:     She will continue her current care and I will see her back in 6 months

## 2013-09-18 ENCOUNTER — Ambulatory Visit: Payer: Medicare Other

## 2013-09-18 ENCOUNTER — Other Ambulatory Visit: Payer: Medicare Other | Admitting: Lab

## 2013-09-18 ENCOUNTER — Ambulatory Visit: Payer: Medicare Other | Admitting: Hematology & Oncology

## 2013-10-09 ENCOUNTER — Ambulatory Visit: Payer: Medicare Other

## 2013-10-09 ENCOUNTER — Ambulatory Visit (HOSPITAL_BASED_OUTPATIENT_CLINIC_OR_DEPARTMENT_OTHER)
Admission: RE | Admit: 2013-10-09 | Discharge: 2013-10-09 | Disposition: A | Discharge status: Home / Self Care | Payer: Medicare Other | Source: Ambulatory Visit | Attending: Hematology & Oncology | Admitting: Hematology & Oncology

## 2013-10-09 ENCOUNTER — Encounter: Payer: Self-pay | Admitting: Hematology & Oncology

## 2013-10-09 ENCOUNTER — Ambulatory Visit (HOSPITAL_BASED_OUTPATIENT_CLINIC_OR_DEPARTMENT_OTHER): Payer: Medicare Other | Admitting: Hematology & Oncology

## 2013-10-09 ENCOUNTER — Other Ambulatory Visit: Payer: Medicare Other | Admitting: Lab

## 2013-10-09 VITALS — BP 157/58 | HR 46 | Temp 98.2°F | Resp 14 | Ht 62.0 in | Wt 164.0 lb

## 2013-10-09 DIAGNOSIS — M79671 Pain in right foot: Secondary | ICD-10-CM

## 2013-10-09 DIAGNOSIS — M79604 Pain in right leg: Secondary | ICD-10-CM

## 2013-10-09 DIAGNOSIS — C50919 Malignant neoplasm of unspecified site of unspecified female breast: Secondary | ICD-10-CM

## 2013-10-09 DIAGNOSIS — M79605 Pain in left leg: Principal | ICD-10-CM

## 2013-10-09 DIAGNOSIS — Z170 Estrogen receptor positive status [ER+]: Secondary | ICD-10-CM

## 2013-10-09 DIAGNOSIS — M79609 Pain in unspecified limb: Secondary | ICD-10-CM

## 2013-10-09 DIAGNOSIS — M79672 Pain in left foot: Principal | ICD-10-CM

## 2013-10-09 DIAGNOSIS — C50911 Malignant neoplasm of unspecified site of right female breast: Secondary | ICD-10-CM

## 2013-10-09 DIAGNOSIS — R252 Cramp and spasm: Secondary | ICD-10-CM | POA: Insufficient documentation

## 2013-10-09 NOTE — Progress Notes (Signed)
This office note has been dictated.

## 2013-10-11 NOTE — Progress Notes (Signed)
CC:   Ronaldo Miyamoto, M.D.  DIAGNOSES: 1. 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 2. Localized pancreatic cancer-status post resection.  CURRENT THERAPY:  Tamoxifen 20 mg p.o. daily.  INTERIM HISTORY:  Ms. Kristi Fuentes comes in for followup.  She is doing fairly well.  She and her husband both elected to forego any chemotherapy or radiation therapy.  She is on tamoxifen alone.  She is doing okay with tamoxifen.  She is complaining of leg cramps. She says she has occasional spasms.  I think we are going to have to check her for thromboembolic disease.  She has had no nausea or vomiting.  There has been no cough or shortness of breath.  She is diabetic.  I am not sure how well her blood sugars are controlled.  She has had no rashes.  She has had no headache.  She has had no dysphagia or odynophagia.  PHYSICAL EXAMINATION:  General:  This is a well-developed, well- nourished white female in no obvious distress.  Vital Signs: Temperature of 98.2, pulse 46, respiratory rate 14, blood pressure 157/58.  Weight is 164 pounds.  Head and Neck:  Normocephalic, atraumatic skull.  There are no ocular or oral lesions.  There are no palpable cervical or supraclavicular lymph nodes.  Lungs:  Clear bilaterally.  Cardiac:  Regular rate and rhythm with a normal S1, S2. She has no murmurs, rubs, or bruits.  Breasts:  Left breast with no masses, edema, or erythema.  There is no left axillary adenopathy. Right breast shows well-healed lumpectomy at the 12 o'clock position. This is adjacent to the areola.  No distinct mass noted in the right breast.  There is no right axillary adenopathy.  Abdomen:  Soft.  She has good bowel sounds.  There is no fluid wave.  There is no palpable hepatomegaly.  She has a laparotomy scar from her Whipple procedure. Her spleen has been taken out.  Extremities:  No clubbing, cyanosis, or edema.  There may be some slight nonpitting edema of  the lower legs bilaterally.  No obvious venous cord is noted.  She has a negative Homan sign with the legs.  She has good pulses in her distal extremities. Neurological:  No focal neurological deficits.  Skin:  No rashes, ecchymosis, or petechia.  LABORATORY STUDIES:  Labs are not done this visit.  IMPRESSION:  Ms. Kristi Fuentes is a very nice 68 year old white female.  She has stage IB ductal carcinoma in the right breast.  She underwent lumpectomy.  Again, she declined adjuvant chemotherapy/Herceptin.  She declined adjuvant radiation therapy.  She did agree to tamoxifen.  We will do Dopplers of her legs today.  I think the risk of thromboembolic disease is low, but yet I must check.  If all is good with the Dopplers, we will plan to get her back in 3 months for followup.  We will check lab work accordingly when we see her back.  I spent a good 40 minutes with her and her husband.  I explained to them why I thought we needed to do Dopplers.  I explained to her the risks of thromboembolic disease with malignancy and with tamoxifen.  They understand this.    ______________________________ Volanda Napoleon, M.D. PRE/MEDQ  D:  10/09/2013  T:  10/10/2013  Job:  [id number]

## 2014-01-08 ENCOUNTER — Ambulatory Visit (HOSPITAL_BASED_OUTPATIENT_CLINIC_OR_DEPARTMENT_OTHER): Payer: Medicare Other | Admitting: Hematology & Oncology

## 2014-01-08 ENCOUNTER — Other Ambulatory Visit (HOSPITAL_BASED_OUTPATIENT_CLINIC_OR_DEPARTMENT_OTHER): Payer: Medicare Other | Admitting: Lab

## 2014-01-08 ENCOUNTER — Encounter: Payer: Self-pay | Admitting: Hematology & Oncology

## 2014-01-08 VITALS — BP 133/63 | HR 53 | Temp 98.1°F | Resp 14 | Ht 65.0 in | Wt 160.0 lb

## 2014-01-08 DIAGNOSIS — Z170 Estrogen receptor positive status [ER+]: Secondary | ICD-10-CM

## 2014-01-08 DIAGNOSIS — C50919 Malignant neoplasm of unspecified site of unspecified female breast: Secondary | ICD-10-CM

## 2014-01-08 DIAGNOSIS — C50911 Malignant neoplasm of unspecified site of right female breast: Secondary | ICD-10-CM

## 2014-01-08 DIAGNOSIS — C50119 Malignant neoplasm of central portion of unspecified female breast: Secondary | ICD-10-CM

## 2014-01-08 LAB — CMP (CANCER CENTER ONLY)
ALT: 17 U/L (ref 10–47)
AST: 31 U/L (ref 11–38)
Albumin: 3.1 g/dL — ABNORMAL LOW (ref 3.3–5.5)
Alkaline Phosphatase: 53 U/L (ref 26–84)
BUN, Bld: 73 mg/dL — ABNORMAL HIGH (ref 7–22)
CO2: 30 meq/L (ref 18–33)
CREATININE: 2.3 mg/dL — AB (ref 0.6–1.2)
Calcium: 8.4 mg/dL (ref 8.0–10.3)
Chloride: 104 mEq/L (ref 98–108)
Glucose, Bld: 128 mg/dL — ABNORMAL HIGH (ref 73–118)
POTASSIUM: 4.8 meq/L — AB (ref 3.3–4.7)
Sodium: 143 mEq/L (ref 128–145)
Total Bilirubin: 0.5 mg/dl (ref 0.20–1.60)
Total Protein: 6.1 g/dL — ABNORMAL LOW (ref 6.4–8.1)

## 2014-01-08 LAB — CBC WITH DIFFERENTIAL (CANCER CENTER ONLY)
BASO#: 0.1 10*3/uL (ref 0.0–0.2)
BASO%: 0.7 % (ref 0.0–2.0)
EOS%: 1.9 % (ref 0.0–7.0)
Eosinophils Absolute: 0.1 10*3/uL (ref 0.0–0.5)
HEMATOCRIT: 32.9 % — AB (ref 34.8–46.6)
HGB: 10.7 g/dL — ABNORMAL LOW (ref 11.6–15.9)
LYMPH#: 2.4 10*3/uL (ref 0.9–3.3)
LYMPH%: 32.5 % (ref 14.0–48.0)
MCH: 31.9 pg (ref 26.0–34.0)
MCHC: 32.5 g/dL (ref 32.0–36.0)
MCV: 98 fL (ref 81–101)
MONO#: 0.8 10*3/uL (ref 0.1–0.9)
MONO%: 10.8 % (ref 0.0–13.0)
NEUT%: 54.1 % (ref 39.6–80.0)
NEUTROS ABS: 4 10*3/uL (ref 1.5–6.5)
PLATELETS: 302 10*3/uL (ref 145–400)
RBC: 3.35 10*6/uL — ABNORMAL LOW (ref 3.70–5.32)
RDW: 12.8 % (ref 11.1–15.7)
WBC: 7.4 10*3/uL (ref 3.9–10.0)

## 2014-01-10 NOTE — Progress Notes (Signed)
Hematology and Oncology Follow Up Visit  Kristi Fuentes [id number] 1946/07/16 68 y.o. 01/10/2014   Principle Diagnosis:  1. 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 2. Localized pancreatic cancer-status post resection. Chronic renal insufficiency  Current Therapy:    Tamoxifen 20 mg by mouth daily     Interim History:  Ms.  Fuentes is back for followup. Last saw Kristi Fuentes back in February. She's been doing okay. She has diabetes. His chronic renal insufficiency. She sees physicians at [facility]  She's had no problems with tamoxifen. She has had no issues with nausea vomiting. There's been no bony pain. She's had no change in bowel or bladder habits. She's had no fever. There's been no bleeding. She's had no headache.  There's been no change in Kristi Fuentes medications.  Medications: Current outpatient prescriptions:amLODipine (NORVASC) 10 MG tablet, Take 10 mg by mouth daily., Disp: , Rfl: ;  atenolol (TENORMIN) 25 MG tablet, Take 25 mg by mouth daily.  , Disp: , Rfl: ;  BD PEN NEEDLE NANO U/F 32G X 4 MM MISC, , Disp: , Rfl: ;  calcium carbonate (TUMS EX) 750 MG chewable tablet, Chew 1 tablet by mouth 2 (two) times daily., Disp: , Rfl:  insulin aspart (NOVOLOG) 100 UNIT/ML injection, Inject 3 Units into the skin 2 (two) times daily. Takes before breakfast and lunch., Disp: , Rfl: ;  insulin glargine (LANTUS) 100 UNIT/ML injection, Inject 22 Units into the skin daily before supper., Disp: , Rfl: ;  Insulin Lispro, Human, (HUMALOG KWIKPEN [place]), Inject into the skin as directed., Disp: , Rfl: ;  lamoTRIgine (LAMICTAL) 200 MG tablet, Take 200 mg by mouth daily. , Disp: , Rfl:  levothyroxine (SYNTHROID, LEVOTHROID) 150 MCG tablet, Take 150 mcg by mouth daily.  , Disp: , Rfl: ;  loratadine (CLARITIN) 10 MG tablet, Take 10 mg by mouth daily.  , Disp: , Rfl: ;  pravastatin (PRAVACHOL) 80 MG tablet, Take 80 mg by mouth daily.  , Disp: , Rfl: ;  tamoxifen (NOLVADEX) 20 MG tablet,  Take 1 tablet (20 mg total) by mouth daily., Disp: 90 tablet, Rfl: 3 zolpidem (AMBIEN) 10 MG tablet, Take 5 mg by mouth at bedtime as needed for sleep., Disp: , Rfl:   Allergies:  Allergies  Allergen Reactions  . Metoclopramide Hcl     Tardive dyskinesia-type symptoms  . Ramipril Cough    Past Medical History, Surgical history, Social history, and Family History were reviewed and updated.  Review of Systems: As above  Physical Exam:  height is 5\' 5"  (1.651 m) and weight is 160 lb (72.576 kg). Kristi Fuentes oral temperature is 98.1 F (36.7 C). Kristi Fuentes blood pressure is 133/63 and Kristi Fuentes pulse is 53. Kristi Fuentes respiration is 14.   Well-developed and well-nourished white female. Head and neck exam shows no ocular or oral lesions. She has no palpable cervical or supraclavicular lymph nodes. Lungs are clear. Cardiac exam regular rate and rhythm with no murmurs rubs or bruits. Breast exam shows left breast with no masses edema or erythema. There is no left axillary adenopathy. Right breast shows a well-healed lumpectomy at the 12:00 position. There is some slight contraction of the right breast. No masses noted in the right breast. There is no right axillary adenopathy. Abdomen is soft. She put a laparotomy scar. There is no fluid wave. There is no palpable liver. She is status post splenectomy. Back exam no tenderness over the spine ribs or hips.  Extremities no clubbing cyanosis or edema. Skin exam no rashes.  Lab Results  Component Value Date   WBC 7.4 01/08/2014   HGB 10.7* 01/08/2014   HCT 32.9* 01/08/2014   MCV 98 01/08/2014   PLT 302 01/08/2014     Chemistry      Component Value Date/Time   NA 143 01/08/2014 0948   NA 137 08/28/2013 1114   NA 139 06/28/2013 1248   K 4.8* 01/08/2014 0948   K 5.2 08/28/2013 1114   K 5.3* 06/28/2013 1248   CL 104 01/08/2014 0948   CL 104 08/28/2013 1114   CO2 30 01/08/2014 0948   CO2 24 08/28/2013 1114   CO2 24 06/28/2013 1248   BUN 73* 01/08/2014 0948   BUN 54* 08/28/2013 1114    BUN 54.1* 06/28/2013 1248   CREATININE 2.3* 01/08/2014 0948   CREATININE 2.21* 08/28/2013 1114   CREATININE 2.1* 06/28/2013 1248      Component Value Date/Time   CALCIUM 8.4 01/08/2014 0948   CALCIUM 8.0* 08/28/2013 1114   CALCIUM 8.6 06/28/2013 1248   ALKPHOS 53 01/08/2014 0948   ALKPHOS 69 08/28/2013 1114   ALKPHOS 97 06/28/2013 1248   AST 31 01/08/2014 0948   AST 25 08/28/2013 1114   AST 24 06/28/2013 1248   ALT 17 01/08/2014 0948   ALT 27 08/28/2013 1114   ALT 26 06/28/2013 1248   BILITOT 0.50 01/08/2014 0948   BILITOT 0.3 08/28/2013 1114   BILITOT 0.31 06/28/2013 1248         Impression and Plan: Kristi Fuentes is 68 year old white female with stage IB ductal carcinoma the right breast. She is a triple positive. She's elected to forego Herceptin or chemotherapy even though she realizes this is standard of care. Kristi Fuentes husband will just not let Kristi Fuentes take any of this. He gets on the Internet and finds all these articles that seem to suggest no benefit.  I think that as long as she is on tamoxifen she is doing well.  she did not get radiation therapy and, again, because Kristi Fuentes husband says that it would not help Kristi Fuentes.  We will plan to get Kristi Fuentes back to see [place] in another 4 months or so.  As always, I spent a good 30-40 minutes with them.   Volanda Napoleon, MD 5/8/20157:09 AM

## 2014-02-21 ENCOUNTER — Encounter (INDEPENDENT_AMBULATORY_CARE_PROVIDER_SITE_OTHER): Payer: Self-pay | Admitting: Surgery

## 2014-02-21 ENCOUNTER — Ambulatory Visit (INDEPENDENT_AMBULATORY_CARE_PROVIDER_SITE_OTHER): Payer: Medicare Other | Admitting: Surgery

## 2014-02-21 VITALS — BP 130/76 | HR 59 | Temp 98.5°F | Ht 62.0 in | Wt 159.0 lb

## 2014-02-21 DIAGNOSIS — Z853 Personal history of malignant neoplasm of breast: Secondary | ICD-10-CM

## 2014-02-21 NOTE — Progress Notes (Signed)
Subjective:     Patient ID: DEZIRAE SERVICE, female   DOB: 04/04/1946, 68 y.o.   MRN: [id number]  HPI She is status post right breast lumpectomy and sentinel lymph node biopsy in September of 2014 for invasive breast cancer. She refused radiation postop and is on tamoxifen. She is still followed closely at [facility] in [place] regarding her previous pancreatic tumor. She has a known incisional hernia. She reports the hernia is getting larger but causes really no discomfort. She has no complaints regarding her breast.  Review of Systems     Objective:   Physical Exam On exam, her incision is well healed the breast. There are no palpable masses. There is no axillary adenopathy. There is no arm swelling    Assessment:     Patient stable with a long-term history of right breast cancer     Plan:     She will continue her current care with the oncologist and continue her tamoxifen. I will see her back in one year.

## 2014-03-10 ENCOUNTER — Other Ambulatory Visit: Payer: Self-pay | Admitting: Hematology & Oncology

## 2014-03-10 DIAGNOSIS — Z853 Personal history of malignant neoplasm of breast: Secondary | ICD-10-CM

## 2014-04-04 ENCOUNTER — Encounter: Payer: Self-pay | Admitting: Hematology & Oncology

## 2014-04-09 ENCOUNTER — Encounter: Payer: Self-pay | Admitting: Hematology & Oncology

## 2014-04-09 ENCOUNTER — Other Ambulatory Visit (HOSPITAL_BASED_OUTPATIENT_CLINIC_OR_DEPARTMENT_OTHER): Payer: Medicare Other | Admitting: Lab

## 2014-04-09 ENCOUNTER — Ambulatory Visit (HOSPITAL_BASED_OUTPATIENT_CLINIC_OR_DEPARTMENT_OTHER): Payer: Medicare Other | Admitting: Hematology & Oncology

## 2014-04-09 VITALS — BP 124/45 | HR 53 | Temp 97.9°F | Resp 14 | Ht 62.0 in | Wt 160.0 lb

## 2014-04-09 DIAGNOSIS — C259 Malignant neoplasm of pancreas, unspecified: Secondary | ICD-10-CM

## 2014-04-09 DIAGNOSIS — C50119 Malignant neoplasm of central portion of unspecified female breast: Secondary | ICD-10-CM

## 2014-04-09 DIAGNOSIS — C50919 Malignant neoplasm of unspecified site of unspecified female breast: Secondary | ICD-10-CM

## 2014-04-09 DIAGNOSIS — C50911 Malignant neoplasm of unspecified site of right female breast: Secondary | ICD-10-CM

## 2014-04-09 DIAGNOSIS — Z170 Estrogen receptor positive status [ER+]: Secondary | ICD-10-CM

## 2014-04-09 LAB — COMPREHENSIVE METABOLIC PANEL
ALK PHOS: 52 U/L (ref 39–117)
ALT: 15 U/L (ref 0–35)
AST: 20 U/L (ref 0–37)
Albumin: 3.8 g/dL (ref 3.5–5.2)
BUN: 54 mg/dL — AB (ref 6–23)
CO2: 26 mEq/L (ref 19–32)
CREATININE: 2.29 mg/dL — AB (ref 0.50–1.10)
Calcium: 8.1 mg/dL — ABNORMAL LOW (ref 8.4–10.5)
Chloride: 106 mEq/L (ref 96–112)
Glucose, Bld: 186 mg/dL — ABNORMAL HIGH (ref 70–99)
Potassium: 4.7 mEq/L (ref 3.5–5.3)
Sodium: 140 mEq/L (ref 135–145)
Total Bilirubin: 0.3 mg/dL (ref 0.2–1.2)
Total Protein: 6.1 g/dL (ref 6.0–8.3)

## 2014-04-09 LAB — LACTATE DEHYDROGENASE: LDH: 248 U/L (ref 94–250)

## 2014-04-09 LAB — CBC WITH DIFFERENTIAL (CANCER CENTER ONLY)
BASO#: 0.1 10*3/uL (ref 0.0–0.2)
BASO%: 1 % (ref 0.0–2.0)
EOS%: 4 % (ref 0.0–7.0)
Eosinophils Absolute: 0.3 10*3/uL (ref 0.0–0.5)
HEMATOCRIT: 34.8 % (ref 34.8–46.6)
HGB: 11.5 g/dL — ABNORMAL LOW (ref 11.6–15.9)
LYMPH#: 1.8 10*3/uL (ref 0.9–3.3)
LYMPH%: 29.2 % (ref 14.0–48.0)
MCH: 32.6 pg (ref 26.0–34.0)
MCHC: 33 g/dL (ref 32.0–36.0)
MCV: 99 fL (ref 81–101)
MONO#: 0.6 10*3/uL (ref 0.1–0.9)
MONO%: 9.8 % (ref 0.0–13.0)
NEUT#: 3.5 10*3/uL (ref 1.5–6.5)
NEUT%: 56 % (ref 39.6–80.0)
PLATELETS: 320 10*3/uL (ref 145–400)
RBC: 3.53 10*6/uL — ABNORMAL LOW (ref 3.70–5.32)
RDW: 13.5 % (ref 11.1–15.7)
WBC: 6.3 10*3/uL (ref 3.9–10.0)

## 2014-04-10 ENCOUNTER — Encounter: Payer: Self-pay | Admitting: Nurse Practitioner

## 2014-04-10 NOTE — Progress Notes (Signed)
Hematology and Oncology Follow Up Visit  Kristi Fuentes [id number] [birth year] 68 y.o. 04/10/2014   Principle Diagnosis:  1. 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 2. Localized pancreatic cancer-status post resection. Chronic renal insufficiency  Current Therapy:    Tamoxifen 20 mg by mouth daily     Interim History:  Ms.  Fuentes is back for followup. Last saw her back in May. She's been doing okay. She has diabetes. She has chronic renal insufficiency. She sees physicians at [facility].  Her husband is worried about low calcium from the tamoxifen. He says he can give me articles about this. I told him that I was aware of this but that we never have had to give extra calcium to women on tamoxifen.  She had scans done at [facility]. She says that these were negative for any recurrence of pancreatic cancer.  She's trying to exercise. She is walking.  They have a new great granddaughter. She was adopted at a [place].  She's had no hot flashes or sweats.  She's had no problems with tamoxifen. She has had no issues with nausea vomiting. There's been no bony pain. She's had no change in bowel or bladder habits. She's had no fever. There's been no bleeding. She's had no headache.  There's been no change in her medications.  Medications: Current outpatient prescriptions:amLODipine (NORVASC) 10 MG tablet, Take 10 mg by mouth daily., Disp: , Rfl: ;  atenolol (TENORMIN) 25 MG tablet, Take 12.5 mg by mouth daily. Takes 1/2 pill to = 12.5 mg. daily, Disp: , Rfl: ;  BD PEN NEEDLE NANO U/F 32G X 4 MM MISC, , Disp: , Rfl: ;  calcium carbonate (TUMS EX) 750 MG chewable tablet, Chew 1 tablet by mouth 2 (two) times daily., Disp: , Rfl:  insulin aspart (NOVOLOG) 100 UNIT/ML injection, Inject 3 Units into the skin 2 (two) times daily. Takes before breakfast and lunch., Disp: , Rfl: ;  insulin glargine (LANTUS) 100 UNIT/ML injection, Inject 22 Units into the skin daily  before supper., Disp: , Rfl: ;  Insulin Lispro, Human, (HUMALOG KWIKPEN [place]), Inject into the skin as directed., Disp: , Rfl: ;  lamoTRIgine (LAMICTAL) 200 MG tablet, Take 200 mg by mouth daily. , Disp: , Rfl:  levothyroxine (SYNTHROID, LEVOTHROID) 150 MCG tablet, Take 150 mcg by mouth daily.  , Disp: , Rfl: ;  loratadine (CLARITIN) 10 MG tablet, Take 10 mg by mouth daily.  , Disp: , Rfl: ;  pravastatin (PRAVACHOL) 80 MG tablet, Take 80 mg by mouth daily.  , Disp: , Rfl: ;  tamoxifen (NOLVADEX) 20 MG tablet, Take 1 tablet (20 mg total) by mouth daily., Disp: 90 tablet, Rfl: 3 zolpidem (AMBIEN) 10 MG tablet, Take 5 mg by mouth at bedtime as needed for sleep., Disp: , Rfl:   Allergies:  Allergies  Allergen Reactions  . Metoclopramide Hcl     Tardive dyskinesia-type symptoms  . Ramipril Cough    Past Medical History, Surgical history, Social history, and Family History were reviewed and updated.  Review of Systems: As above  Physical Exam:  height is 5\' 2"  (1.575 m) and weight is 160 lb (72.576 kg). Her oral temperature is 97.9 F (36.6 C). Her blood pressure is 124/45 and her pulse is 53. Her respiration is 14.   Well-developed and well-nourished white female. Head and neck exam shows no ocular or oral lesions. She has no palpable cervical or supraclavicular lymph  nodes. Lungs are clear. Cardiac exam regular rate and rhythm with no murmurs rubs or bruits. Breast exam shows left breast with no masses edema or erythema. There is no left axillary adenopathy. Right breast shows a well-healed lumpectomy at the 12:00 position. There is some slight contraction of the right breast. No masses noted in the right breast. There is no right axillary adenopathy. Abdomen is soft. She put a laparotomy scar. There is no fluid wave. There is no palpable liver. She is status post splenectomy. Back exam no tenderness over the spine ribs or hips. Extremities no clubbing cyanosis or edema. Skin exam no rashes.  Lab  Results  Component Value Date   WBC 6.3 04/09/2014   HGB 11.5* 04/09/2014   HCT 34.8 04/09/2014   MCV 99 04/09/2014   PLT 320 04/09/2014     Chemistry      Component Value Date/Time   NA 140 04/09/2014 0844   NA 143 01/08/2014 0948   NA 139 06/28/2013 1248   K 4.7 04/09/2014 0844   K 4.8* 01/08/2014 0948   K 5.3* 06/28/2013 1248   CL 106 04/09/2014 0844   CL 104 01/08/2014 0948   CO2 26 04/09/2014 0844   CO2 30 01/08/2014 0948   CO2 24 06/28/2013 1248   BUN 54* 04/09/2014 0844   BUN 73* 01/08/2014 0948   BUN 54.1* 06/28/2013 1248   CREATININE 2.29* 04/09/2014 0844   CREATININE 2.3* 01/08/2014 0948   CREATININE 2.1* 06/28/2013 1248      Component Value Date/Time   CALCIUM 8.1* 04/09/2014 0844   CALCIUM 8.4 01/08/2014 0948   CALCIUM 8.6 06/28/2013 1248   ALKPHOS 52 04/09/2014 0844   ALKPHOS 53 01/08/2014 0948   ALKPHOS 97 06/28/2013 1248   AST 20 04/09/2014 0844   AST 31 01/08/2014 0948   AST 24 06/28/2013 1248   ALT 15 04/09/2014 0844   ALT 17 01/08/2014 0948   ALT 26 06/28/2013 1248   BILITOT 0.3 04/09/2014 0844   BILITOT 0.50 01/08/2014 0948   BILITOT 0.31 06/28/2013 1248         Impression and Plan: Kristi Fuentes is 68 year old white female with stage IB ductal carcinoma the right breast. She is a triple positive. She's elected to forego Herceptin or chemotherapy even though she realizes this is standard of care. Her husband will just not let her take any of this. I think that as long as she is on tamoxifen she is doing well.  she did not get radiation therapy and, again, because her husband says that it would not help her.  We will plan to get her back to see [place] in another 4 months or so.  As always, I spent a good 30-40 minutes with them.   Volanda Napoleon, MD 8/6/20157:41 AM

## 2014-04-23 ENCOUNTER — Ambulatory Visit
Admission: RE | Admit: 2014-04-23 | Discharge: 2014-04-23 | Disposition: A | Discharge status: Home / Self Care | Payer: Medicare Other | Source: Ambulatory Visit | Attending: Hematology & Oncology | Admitting: Hematology & Oncology

## 2014-04-23 ENCOUNTER — Other Ambulatory Visit: Payer: Self-pay | Admitting: Hematology & Oncology

## 2014-04-23 DIAGNOSIS — Z853 Personal history of malignant neoplasm of breast: Secondary | ICD-10-CM

## 2014-07-07 ENCOUNTER — Encounter: Payer: Self-pay | Admitting: Hematology & Oncology

## 2014-07-09 ENCOUNTER — Ambulatory Visit (HOSPITAL_BASED_OUTPATIENT_CLINIC_OR_DEPARTMENT_OTHER): Payer: Medicare Other | Admitting: Hematology & Oncology

## 2014-07-09 ENCOUNTER — Encounter: Payer: Self-pay | Admitting: Hematology & Oncology

## 2014-07-09 ENCOUNTER — Telehealth: Payer: Self-pay | Admitting: Hematology & Oncology

## 2014-07-09 ENCOUNTER — Other Ambulatory Visit (HOSPITAL_BASED_OUTPATIENT_CLINIC_OR_DEPARTMENT_OTHER): Payer: Medicare Other | Admitting: Lab

## 2014-07-09 VITALS — BP 128/62 | HR 55 | Temp 98.2°F | Resp 15 | Ht 62.0 in | Wt 148.0 lb

## 2014-07-09 DIAGNOSIS — N189 Chronic kidney disease, unspecified: Secondary | ICD-10-CM

## 2014-07-09 DIAGNOSIS — C50111 Malignant neoplasm of central portion of right female breast: Secondary | ICD-10-CM

## 2014-07-09 DIAGNOSIS — M549 Dorsalgia, unspecified: Secondary | ICD-10-CM

## 2014-07-09 DIAGNOSIS — E119 Type 2 diabetes mellitus without complications: Secondary | ICD-10-CM

## 2014-07-09 DIAGNOSIS — C50811 Malignant neoplasm of overlapping sites of right female breast: Secondary | ICD-10-CM

## 2014-07-09 DIAGNOSIS — C50911 Malignant neoplasm of unspecified site of right female breast: Secondary | ICD-10-CM

## 2014-07-09 LAB — CBC WITH DIFFERENTIAL (CANCER CENTER ONLY)
BASO#: 0.1 10*3/uL (ref 0.0–0.2)
BASO%: 1.2 % (ref 0.0–2.0)
EOS%: 6.7 % (ref 0.0–7.0)
Eosinophils Absolute: 0.4 10*3/uL (ref 0.0–0.5)
HEMATOCRIT: 33.7 % — AB (ref 34.8–46.6)
HEMOGLOBIN: 11.2 g/dL — AB (ref 11.6–15.9)
LYMPH#: 1.7 10*3/uL (ref 0.9–3.3)
LYMPH%: 26.1 % (ref 14.0–48.0)
MCH: 32.9 pg (ref 26.0–34.0)
MCHC: 33.2 g/dL (ref 32.0–36.0)
MCV: 99 fL (ref 81–101)
MONO#: 0.8 10*3/uL (ref 0.1–0.9)
MONO%: 11.5 % (ref 0.0–13.0)
NEUT#: 3.6 10*3/uL (ref 1.5–6.5)
NEUT%: 54.5 % (ref 39.6–80.0)
Platelets: 316 10*3/uL (ref 145–400)
RBC: 3.4 10*6/uL — ABNORMAL LOW (ref 3.70–5.32)
RDW: 12.8 % (ref 11.1–15.7)
WBC: 6.5 10*3/uL (ref 3.9–10.0)

## 2014-07-09 LAB — CMP (CANCER CENTER ONLY)
ALK PHOS: 50 U/L (ref 26–84)
ALT(SGPT): 22 U/L (ref 10–47)
AST: 20 U/L (ref 11–38)
Albumin: 3.3 g/dL (ref 3.3–5.5)
BUN, Bld: 57 mg/dL — ABNORMAL HIGH (ref 7–22)
CALCIUM: 9.8 mg/dL (ref 8.0–10.3)
CO2: 27 mEq/L (ref 18–33)
CREATININE: 2.6 mg/dL — AB (ref 0.6–1.2)
Chloride: 99 mEq/L (ref 98–108)
Glucose, Bld: 151 mg/dL — ABNORMAL HIGH (ref 73–118)
Potassium: 4.3 mEq/L (ref 3.3–4.7)
Sodium: 142 mEq/L (ref 128–145)
Total Bilirubin: 0.5 mg/dl (ref 0.20–1.60)
Total Protein: 6.2 g/dL — ABNORMAL LOW (ref 6.4–8.1)

## 2014-07-09 MED ORDER — TAMOXIFEN CITRATE 20 MG PO TABS
20.0000 mg | ORAL_TABLET | Freq: Every day | ORAL | Status: AC
Start: 2014-07-09 — End: ?

## 2014-07-09 MED ORDER — TAMOXIFEN CITRATE 20 MG PO TABS: 20.0000 mg | ORAL_TABLET | Freq: Every day | ORAL | Status: DC

## 2014-07-09 NOTE — Telephone Encounter (Signed)
Per MD careplan for herceptin is not valid

## 2014-07-10 NOTE — Progress Notes (Signed)
Hematology and Oncology Follow Up Visit  Kristi Fuentes [id number] 10-18-1945 68 y.o. 07/10/2014   Principle Diagnosis:  1. 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 2. Localized pancreatic cancer-status post resection. Chronic renal insufficiency  Current Therapy:    Tamoxifen 20 mg by mouth daily     Interim History:  Ms.  Fuentes is back for followup. She has been having a lot of problems with her back. Her lower back is really bothering her. She is seeing an orthopedist in [place]. She did have a MRI done. This shows a lot of degenerative changes. This is throughout the entire lumbar spine. There is no evidence of malignancy.  She goes back to see them tomorrow. Possibly, some epidural injections can help. I'm not sure what can be done surgically. Her kidneys have been doing okay. She has some chronic renal insufficiency. This does not appear to be any worse.  Her blood sugars have been doing okay. Possibly, because of the stress that she is under with the back pain, she has some elevated sugars. She's trying to exercise. She is walking.  She is on tamoxifen. She is doing well with tamoxifen. There is an element of depression but this is not from tamoxifen in my mind.  Overall, her performance status is ECOG 1.  Medications: Current outpatient prescriptions: amLODipine (NORVASC) 10 MG tablet, Take 10 mg by mouth daily., Disp: , Rfl: ;  atenolol (TENORMIN) 25 MG tablet, Take 12.5 mg by mouth daily. Takes 1/2 pill to = 12.5 mg. daily, Disp: , Rfl: ;  calcium carbonate (TUMS EX) 750 MG chewable tablet, Chew 1 tablet by mouth 2 (two) times daily., Disp: , Rfl: ;  clonazePAM (KLONOPIN) 0.5 MG tablet, Take 0.5 mg by mouth as needed for anxiety., Disp: , Rfl:  insulin aspart (NOVOLOG) 100 UNIT/ML injection, Inject 3 Units into the skin 2 (two) times daily. Takes before breakfast and lunch., Disp: , Rfl: ;  insulin glargine (LANTUS) 100 UNIT/ML injection,  Inject 22 Units into the skin daily before supper., Disp: , Rfl: ;  lamoTRIgine (LAMICTAL) 200 MG tablet, Take 200 mg by mouth daily. , Disp: , Rfl: ;  levothyroxine (SYNTHROID, LEVOTHROID) 150 MCG tablet, Take 150 mcg by mouth daily.  , Disp: , Rfl:  loratadine (CLARITIN) 10 MG tablet, Take 10 mg by mouth daily.  , Disp: , Rfl: ;  Lurasidone HCl (LATUDA) 20 MG TABS, Take by mouth every morning., Disp: , Rfl: ;  pravastatin (PRAVACHOL) 80 MG tablet, Take 80 mg by mouth daily.  , Disp: , Rfl: ;  tamoxifen (NOLVADEX) 20 MG tablet, Take 1 tablet (20 mg total) by mouth daily., Disp: 90 tablet, Rfl: 3;  zolpidem (AMBIEN) 10 MG tablet, Take 5 mg by mouth at bedtime as needed for sleep., Disp: , Rfl:   Allergies:  Allergies  Allergen Reactions  . Metoclopramide Hcl     Tardive dyskinesia-type symptoms  . Ramipril Cough    Past Medical History, Surgical history, Social history, and Family History were reviewed and updated.  Review of Systems: As above  Physical Exam:  height is 5\' 2"  (1.575 m) and weight is 148 lb (67.132 kg). Her oral temperature is 98.2 F (36.8 C). Her blood pressure is 128/62 and her pulse is 55. Her respiration is 15.   Well-developed and well-nourished white female. Head and neck exam shows no ocular or oral lesions. She has no palpable cervical or supraclavicular lymph  nodes. Lungs are clear. Cardiac exam regular rate and rhythm with no murmurs rubs or bruits. Breast exam shows left breast with no masses edema or erythema. There is no left axillary adenopathy. Right breast shows a well-healed lumpectomy at the 12:00 position. There is some slight contraction of the right breast. No masses noted in the right breast. There is no right axillary adenopathy. Abdomen is soft. She has a well-healed laparotomy scar. There is no fluid wave. There is no palpable liver. She is status post splenectomy. Back exam no tenderness over the spine ribs or hips. there may be some spasms in the lumbar  spine. Extremities no clubbing cyanosis or edema. Skin exam no rashes.neurological exam is nonfocal.  Lab Results  Component Value Date   WBC 6.5 07/09/2014   HGB 11.2* 07/09/2014   HCT 33.7* 07/09/2014   MCV 99 07/09/2014   PLT 316 07/09/2014     Chemistry      Component Value Date/Time   NA 142 07/09/2014 0839   NA 140 04/09/2014 0844   NA 139 06/28/2013 1248   K 4.3 07/09/2014 0839   K 4.7 04/09/2014 0844   K 5.3* 06/28/2013 1248   CL 99 07/09/2014 0839   CL 106 04/09/2014 0844   CO2 27 07/09/2014 0839   CO2 26 04/09/2014 0844   CO2 24 06/28/2013 1248   BUN 57* 07/09/2014 0839   BUN 54* 04/09/2014 0844   BUN 54.1* 06/28/2013 1248   CREATININE 2.6* 07/09/2014 0839   CREATININE 2.29* 04/09/2014 0844   CREATININE 2.1* 06/28/2013 1248      Component Value Date/Time   CALCIUM 9.8 07/09/2014 0839   CALCIUM 8.1* 04/09/2014 0844   CALCIUM 8.6 06/28/2013 1248   ALKPHOS 50 07/09/2014 0839   ALKPHOS 52 04/09/2014 0844   ALKPHOS 97 06/28/2013 1248   AST 20 07/09/2014 0839   AST 20 04/09/2014 0844   AST 24 06/28/2013 1248   ALT 22 07/09/2014 0839   ALT 15 04/09/2014 0844   ALT 26 06/28/2013 1248   BILITOT 0.50 07/09/2014 0839   BILITOT 0.3 04/09/2014 0844   BILITOT 0.31 06/28/2013 1248         Impression and Plan: Kristi Fuentes is 68 year old white female with stage IB ductal carcinoma the right breast. She is a triple positive. She's elected to forego Herceptin or chemotherapy even though she realizes this is standard of care. Her husband will just not let her take any of this. I think that as long as she is on tamoxifen she should do well.  She did not get any radiation therapy as her husband did not feel that this was appropriate for her. As such, she is at risk for local recurrence.  Her big problem right now is her back. Hopefully, the orthopedist can help this without surgery.  We will plan to get her back to see [place] in another 4 months or so.  As always, I spent a  good 30-40 minutes with them.   Volanda Napoleon, MD 11/5/20155:53 PM

## 2014-07-11 ENCOUNTER — Encounter: Payer: Self-pay | Admitting: Hematology & Oncology

## 2014-10-07 ENCOUNTER — Encounter: Payer: Self-pay | Admitting: Nurse Practitioner

## 2014-10-07 NOTE — Progress Notes (Signed)
Pt's husband called regarding her appointment on 10/08/14. States he does not think she needs to come. Per Dr. Marin Olp ok for them to cx appointment. Per husband they will call and r/s at a later date and time. Dr. Marin Olp is aware and ok with this.

## 2014-10-08 ENCOUNTER — Other Ambulatory Visit: Payer: Medicare Other | Admitting: Lab

## 2014-10-08 ENCOUNTER — Encounter: Payer: Medicare Other | Admitting: Hematology & Oncology

## 2014-10-13 ENCOUNTER — Other Ambulatory Visit: Payer: Self-pay | Admitting: Family

## 2014-10-15 NOTE — Progress Notes (Signed)
This encounter was created in error - please disregard.

## 2014-12-30 ENCOUNTER — Emergency Department (HOSPITAL_COMMUNITY): Payer: Medicare Other

## 2014-12-30 ENCOUNTER — Encounter (HOSPITAL_COMMUNITY): Payer: Self-pay

## 2014-12-30 ENCOUNTER — Inpatient Hospital Stay (HOSPITAL_COMMUNITY): Payer: Medicare Other

## 2014-12-30 ENCOUNTER — Inpatient Hospital Stay (HOSPITAL_COMMUNITY)
Admission: EM | Admit: 2014-12-30 | Discharge: 2015-01-05 | DRG: 917 | Disposition: A | Discharge status: Home Health | Payer: Medicare Other | Attending: Internal Medicine | Admitting: Internal Medicine

## 2014-12-30 DIAGNOSIS — I129 Hypertensive chronic kidney disease with stage 1 through stage 4 chronic kidney disease, or unspecified chronic kidney disease: Secondary | ICD-10-CM | POA: Diagnosis present

## 2014-12-30 DIAGNOSIS — Z85850 Personal history of malignant neoplasm of thyroid: Secondary | ICD-10-CM

## 2014-12-30 DIAGNOSIS — T39011A Poisoning by aspirin, accidental (unintentional), initial encounter: Secondary | ICD-10-CM | POA: Diagnosis not present

## 2014-12-30 DIAGNOSIS — F3130 Bipolar disorder, current episode depressed, mild or moderate severity, unspecified: Secondary | ICD-10-CM | POA: Diagnosis not present

## 2014-12-30 DIAGNOSIS — E119 Type 2 diabetes mellitus without complications: Secondary | ICD-10-CM | POA: Diagnosis present

## 2014-12-30 DIAGNOSIS — T17998A Other foreign object in respiratory tract, part unspecified causing other injury, initial encounter: Secondary | ICD-10-CM | POA: Diagnosis not present

## 2014-12-30 DIAGNOSIS — Z85831 Personal history of malignant neoplasm of soft tissue: Secondary | ICD-10-CM

## 2014-12-30 DIAGNOSIS — T17908A Unspecified foreign body in respiratory tract, part unspecified causing other injury, initial encounter: Secondary | ICD-10-CM | POA: Insufficient documentation

## 2014-12-30 DIAGNOSIS — E875 Hyperkalemia: Secondary | ICD-10-CM | POA: Diagnosis present

## 2014-12-30 DIAGNOSIS — I959 Hypotension, unspecified: Secondary | ICD-10-CM | POA: Diagnosis present

## 2014-12-30 DIAGNOSIS — Z9221 Personal history of antineoplastic chemotherapy: Secondary | ICD-10-CM | POA: Diagnosis not present

## 2014-12-30 DIAGNOSIS — E890 Postprocedural hypothyroidism: Secondary | ICD-10-CM | POA: Diagnosis present

## 2014-12-30 DIAGNOSIS — J9600 Acute respiratory failure, unspecified whether with hypoxia or hypercapnia: Secondary | ICD-10-CM | POA: Diagnosis present

## 2014-12-30 DIAGNOSIS — J690 Pneumonitis due to inhalation of food and vomit: Secondary | ICD-10-CM | POA: Diagnosis not present

## 2014-12-30 DIAGNOSIS — C254 Malignant neoplasm of endocrine pancreas: Secondary | ICD-10-CM | POA: Diagnosis present

## 2014-12-30 DIAGNOSIS — Z4659 Encounter for fitting and adjustment of other gastrointestinal appliance and device: Secondary | ICD-10-CM | POA: Diagnosis not present

## 2014-12-30 DIAGNOSIS — N179 Acute kidney failure, unspecified: Secondary | ICD-10-CM | POA: Diagnosis present

## 2014-12-30 DIAGNOSIS — E785 Hyperlipidemia, unspecified: Secondary | ICD-10-CM | POA: Diagnosis present

## 2014-12-30 DIAGNOSIS — Z79899 Other long term (current) drug therapy: Secondary | ICD-10-CM

## 2014-12-30 DIAGNOSIS — T39092A Poisoning by salicylates, intentional self-harm, initial encounter: Secondary | ICD-10-CM

## 2014-12-30 DIAGNOSIS — T39094A Poisoning by salicylates, undetermined, initial encounter: Secondary | ICD-10-CM

## 2014-12-30 DIAGNOSIS — T39091A Poisoning by salicylates, accidental (unintentional), initial encounter: Secondary | ICD-10-CM | POA: Diagnosis present

## 2014-12-30 DIAGNOSIS — I10 Essential (primary) hypertension: Secondary | ICD-10-CM | POA: Diagnosis not present

## 2014-12-30 DIAGNOSIS — R4182 Altered mental status, unspecified: Secondary | ICD-10-CM | POA: Diagnosis present

## 2014-12-30 DIAGNOSIS — T39012A Poisoning by aspirin, intentional self-harm, initial encounter: Secondary | ICD-10-CM | POA: Diagnosis present

## 2014-12-30 DIAGNOSIS — E874 Mixed disorder of acid-base balance: Secondary | ICD-10-CM | POA: Diagnosis present

## 2014-12-30 DIAGNOSIS — Z794 Long term (current) use of insulin: Secondary | ICD-10-CM

## 2014-12-30 DIAGNOSIS — T39014A Poisoning by aspirin, undetermined, initial encounter: Secondary | ICD-10-CM

## 2014-12-30 DIAGNOSIS — Z79810 Long term (current) use of selective estrogen receptor modulators (SERMs): Secondary | ICD-10-CM

## 2014-12-30 DIAGNOSIS — N184 Chronic kidney disease, stage 4 (severe): Secondary | ICD-10-CM | POA: Diagnosis present

## 2014-12-30 DIAGNOSIS — D649 Anemia, unspecified: Secondary | ICD-10-CM | POA: Diagnosis present

## 2014-12-30 DIAGNOSIS — E118 Type 2 diabetes mellitus with unspecified complications: Secondary | ICD-10-CM | POA: Diagnosis not present

## 2014-12-30 DIAGNOSIS — Y92015 Private garage of single-family (private) house as the place of occurrence of the external cause: Secondary | ICD-10-CM

## 2014-12-30 DIAGNOSIS — G9340 Encephalopathy, unspecified: Secondary | ICD-10-CM

## 2014-12-30 DIAGNOSIS — D0511 Intraductal carcinoma in situ of right breast: Secondary | ICD-10-CM | POA: Diagnosis present

## 2014-12-30 DIAGNOSIS — F319 Bipolar disorder, unspecified: Secondary | ICD-10-CM | POA: Diagnosis present

## 2014-12-30 DIAGNOSIS — Z96652 Presence of left artificial knee joint: Secondary | ICD-10-CM | POA: Diagnosis present

## 2014-12-30 DIAGNOSIS — E1129 Type 2 diabetes mellitus with other diabetic kidney complication: Secondary | ICD-10-CM | POA: Diagnosis not present

## 2014-12-30 LAB — ACETAMINOPHEN LEVEL: Acetaminophen (Tylenol), Serum: <10 ug/mL — ABNORMAL LOW (ref 10–30)

## 2014-12-30 LAB — URINALYSIS, ROUTINE W REFLEX MICROSCOPIC
Bilirubin Urine: NEGATIVE
Glucose, UA: NEGATIVE mg/dL
Ketones, ur: 15 mg/dL — AB
LEUKOCYTES UA: NEGATIVE
Nitrite: NEGATIVE
Protein, ur: 100 mg/dL — AB
Specific Gravity, Urine: 1.013 (ref 1.005–1.030)
Urobilinogen, UA: 0.2 mg/dL (ref 0.0–1.0)
pH: 6.5 (ref 5.0–8.0)

## 2014-12-30 LAB — CBC WITH DIFFERENTIAL/PLATELET
BASOS ABS: 0 10*3/uL (ref 0.0–0.1)
Basophils Relative: 0 % (ref 0–1)
EOS PCT: 0 % (ref 0–5)
Eosinophils Absolute: 0 10*3/uL (ref 0.0–0.7)
HCT: 34.5 % — ABNORMAL LOW (ref 36.0–46.0)
Hemoglobin: 11.2 g/dL — ABNORMAL LOW (ref 12.0–15.0)
LYMPHS PCT: 4 % — AB (ref 12–46)
Lymphs Abs: 0.7 10*3/uL (ref 0.7–4.0)
MCH: 31.5 pg (ref 26.0–34.0)
MCHC: 32.5 g/dL (ref 30.0–36.0)
MCV: 96.9 fL (ref 78.0–100.0)
MONO ABS: 0.6 10*3/uL (ref 0.1–1.0)
MONOS PCT: 3 % (ref 3–12)
Neutro Abs: 16.3 10*3/uL — ABNORMAL HIGH (ref 1.7–7.7)
Neutrophils Relative %: 93 % — ABNORMAL HIGH (ref 43–77)
PLATELETS: 323 10*3/uL (ref 150–400)
RBC: 3.56 MIL/uL — AB (ref 3.87–5.11)
RDW: 13.6 % (ref 11.5–15.5)
WBC: 17.6 10*3/uL — AB (ref 4.0–10.5)

## 2014-12-30 LAB — URINE MICROSCOPIC-ADD ON

## 2014-12-30 LAB — BASIC METABOLIC PANEL
Anion gap: 23 — ABNORMAL HIGH (ref 5–15)
BUN: 72 mg/dL — ABNORMAL HIGH (ref 6–23)
CHLORIDE: 104 mmol/L (ref 96–112)
CO2: 18 mmol/L — ABNORMAL LOW (ref 19–32)
Calcium: 8.3 mg/dL — ABNORMAL LOW (ref 8.4–10.5)
Creatinine, Ser: 3.64 mg/dL — ABNORMAL HIGH (ref 0.50–1.10)
GFR calc Af Amer: 14 mL/min — ABNORMAL LOW (ref >90)
GFR calc non Af Amer: 12 mL/min — ABNORMAL LOW (ref >90)
Glucose, Bld: 125 mg/dL — ABNORMAL HIGH (ref 70–99)
POTASSIUM: 4.4 mmol/L (ref 3.5–5.1)
SODIUM: 145 mmol/L (ref 135–145)

## 2014-12-30 LAB — COMPREHENSIVE METABOLIC PANEL
ALT: 26 U/L (ref 0–35)
ANION GAP: 19 — AB (ref 5–15)
AST: 34 U/L (ref 0–37)
Albumin: 3.9 g/dL (ref 3.5–5.2)
Alkaline Phosphatase: 78 U/L (ref 39–117)
BILIRUBIN TOTAL: 0.4 mg/dL (ref 0.3–1.2)
BUN: 71 mg/dL — AB (ref 6–23)
CO2: 16 mmol/L — ABNORMAL LOW (ref 19–32)
CREATININE: 3.43 mg/dL — AB (ref 0.50–1.10)
Calcium: 8.9 mg/dL (ref 8.4–10.5)
Chloride: 107 mmol/L (ref 96–112)
GFR, EST AFRICAN AMERICAN: 15 mL/min — AB (ref >90)
GFR, EST NON AFRICAN AMERICAN: 13 mL/min — AB (ref >90)
GLUCOSE: 133 mg/dL — AB (ref 70–99)
POTASSIUM: 6 mmol/L — AB (ref 3.5–5.1)
SODIUM: 142 mmol/L (ref 135–145)
Total Protein: 7.5 g/dL (ref 6.0–8.3)

## 2014-12-30 LAB — POCT I-STAT 3, ART BLOOD GAS (G3+)
ACID-BASE EXCESS: 5 mmol/L — AB (ref 0.0–2.0)
Acid-Base Excess: 2 mmol/L (ref 0.0–2.0)
Acid-base deficit: 1 mmol/L (ref 0.0–2.0)
BICARBONATE: 18.9 meq/L — AB (ref 20.0–24.0)
BICARBONATE: 24.5 meq/L — AB (ref 20.0–24.0)
Bicarbonate: 27.4 mEq/L — ABNORMAL HIGH (ref 20.0–24.0)
O2 SAT: 99 %
O2 Saturation: 98 %
O2 Saturation: 100 %
PCO2 ART: 30.3 mmHg — AB (ref 35.0–45.0)
PH ART: 7.562 — AB (ref 7.350–7.450)
PO2 ART: 79 mmHg — AB (ref 80.0–100.0)
PO2 ART: 121 mmHg — AB (ref 80.0–100.0)
Patient temperature: 98
TCO2: 19 mmol/L (ref 0–100)
TCO2: 25 mmol/L (ref 0–100)
TCO2: 28 mmol/L (ref 0–100)
pCO2 arterial: 17.1 mmHg — CL (ref 35.0–45.0)
pCO2 arterial: 30.4 mmHg — ABNORMAL LOW (ref 35.0–45.0)
pH, Arterial: 7.652 (ref 7.350–7.450)
pH, Arterial: 7.517 — ABNORMAL HIGH (ref 7.350–7.450)
pO2, Arterial: 148 mmHg — ABNORMAL HIGH (ref 80.0–100.0)

## 2014-12-30 LAB — CARBOXYHEMOGLOBIN
Carboxyhemoglobin: 0.4 % — ABNORMAL LOW (ref 0.5–1.5)
METHEMOGLOBIN: 1.2 % (ref 0.0–1.5)
O2 SAT: 38.4 %
TOTAL HEMOGLOBIN: 15.6 g/dL (ref 12.0–16.0)

## 2014-12-30 LAB — SALICYLATE LEVEL
SALICYLATE LVL: 20.9 mg/dL — AB (ref 2.8–20.0)
Salicylate Lvl: 95.6 mg/dL (ref 2.8–20.0)
Salicylate Lvl: 24.6 mg/dL — ABNORMAL HIGH (ref 2.8–20.0)

## 2014-12-30 LAB — CBG MONITORING, ED
GLUCOSE-CAPILLARY: 119 mg/dL — AB (ref 70–99)
Glucose-Capillary: 163 mg/dL — ABNORMAL HIGH (ref 70–99)

## 2014-12-30 LAB — PROTIME-INR
INR: 1.27 (ref 0.00–1.49)
Prothrombin Time: 16 seconds — ABNORMAL HIGH (ref 11.6–15.2)

## 2014-12-30 LAB — RAPID URINE DRUG SCREEN, HOSP PERFORMED
Amphetamines: NOT DETECTED
BARBITURATES: NOT DETECTED
Benzodiazepines: NOT DETECTED
Cocaine: NOT DETECTED
Opiates: NOT DETECTED
Tetrahydrocannabinol: NOT DETECTED

## 2014-12-30 LAB — ALBUMIN: Albumin: 3 g/dL — ABNORMAL LOW (ref 3.5–5.2)

## 2014-12-30 LAB — ETHANOL

## 2014-12-30 LAB — LACTIC ACID, PLASMA
Lactic Acid, Venous: 2.4 mmol/L (ref 0.5–2.0)
Lactic Acid, Venous: 1.7 mmol/L (ref 0.5–2.0)

## 2014-12-30 LAB — GLUCOSE, CAPILLARY
Glucose-Capillary: 107 mg/dL — ABNORMAL HIGH (ref 70–99)
Glucose-Capillary: 94 mg/dL (ref 70–99)

## 2014-12-30 LAB — MRSA PCR SCREENING: MRSA by PCR: NEGATIVE

## 2014-12-30 MED ORDER — SODIUM CHLORIDE 0.9 % IV SOLN: 250.0000 mL | INTRAVENOUS | Status: DC | PRN

## 2014-12-30 MED ORDER — SODIUM BICARBONATE 8.4 % IV SOLN: 150.0000 meq | Freq: Once | INTRAVENOUS | Status: DC

## 2014-12-30 MED ORDER — SODIUM CHLORIDE 0.9 % IV SOLN
1.0000 g | Freq: Once | INTRAVENOUS | Status: AC
  Administered 2014-12-30: 1 g via INTRAVENOUS

## 2014-12-30 MED ORDER — CALCIUM GLUCONATE 10 % IV SOLN
1.0000 g | Freq: Once | INTRAVENOUS | Status: DC
  Filled 2014-12-30: qty 10

## 2014-12-30 MED ORDER — SODIUM BICARBONATE 8.4 % IV SOLN
100.0000 meq | Freq: Once | INTRAVENOUS | Status: AC
  Administered 2014-12-30: 100 meq via INTRAVENOUS
  Filled 2014-12-30: qty 100

## 2014-12-30 MED ORDER — FENTANYL BOLUS VIA INFUSION
25.0000 ug | INTRAVENOUS | Status: DC | PRN
  Filled 2014-12-30: qty 25

## 2014-12-30 MED ORDER — DEXTROSE 5 % IV SOLN
INTRAVENOUS | Status: DC
  Administered 2014-12-30 – 2014-12-31 (×3): via INTRAVENOUS
  Filled 2014-12-30 (×6): qty 150

## 2014-12-30 MED ORDER — SODIUM BICARBONATE 8.4 % IV SOLN: 50.0000 meq | Freq: Once | INTRAVENOUS | Status: DC

## 2014-12-30 MED ORDER — DEXTROSE 5 % IV SOLN: Freq: Once | INTRAVENOUS | Status: DC

## 2014-12-30 MED ORDER — ALBUTEROL SULFATE (2.5 MG/3ML) 0.083% IN NEBU: 2.5000 mg | INHALATION_SOLUTION | RESPIRATORY_TRACT | Status: DC | PRN

## 2014-12-30 MED ORDER — SODIUM CHLORIDE 0.9 % IV SOLN: 100.0000 mL | INTRAVENOUS | Status: DC | PRN

## 2014-12-30 MED ORDER — FENTANYL CITRATE (PF) 100 MCG/2ML IJ SOLN
INTRAMUSCULAR | Status: AC
  Filled 2014-12-30: qty 2

## 2014-12-30 MED ORDER — SODIUM BICARBONATE 8.4 % IV SOLN
INTRAVENOUS | Status: AC
  Filled 2014-12-30: qty 100

## 2014-12-30 MED ORDER — CHLORHEXIDINE GLUCONATE 0.12 % MT SOLN
15.0000 mL | Freq: Two times a day (BID) | OROMUCOSAL | Status: DC
  Administered 2014-12-30 – 2014-12-31 (×2): 15 mL via OROMUCOSAL
  Filled 2014-12-30 (×2): qty 15

## 2014-12-30 MED ORDER — SODIUM CHLORIDE 0.9 % IV BOLUS (SEPSIS)
1000.0000 mL | Freq: Once | INTRAVENOUS | Status: AC
  Administered 2014-12-30: 1000 mL via INTRAVENOUS

## 2014-12-30 MED ORDER — HYDROGEN PEROXIDE 3 % EX SOLN
CUTANEOUS | Status: AC
  Filled 2014-12-30: qty 473

## 2014-12-30 MED ORDER — HEPARIN SODIUM (PORCINE) 1000 UNIT/ML DIALYSIS: 20.0000 [IU]/kg | INTRAMUSCULAR | Status: DC | PRN

## 2014-12-30 MED ORDER — ONDANSETRON HCL 4 MG/2ML IJ SOLN
4.0000 mg | Freq: Four times a day (QID) | INTRAMUSCULAR | Status: DC | PRN
  Administered 2014-12-31 – 2015-01-02 (×2): 4 mg via INTRAVENOUS
  Filled 2014-12-30 (×2): qty 2

## 2014-12-30 MED ORDER — ALTEPLASE 2 MG IJ SOLR: 2.0000 mg | Freq: Once | INTRAMUSCULAR | Status: AC | PRN

## 2014-12-30 MED ORDER — DEXTROSE 50 % IV SOLN
1.0000 | Freq: Once | INTRAVENOUS | Status: DC
  Filled 2014-12-30: qty 50

## 2014-12-30 MED ORDER — HEPARIN SODIUM (PORCINE) 1000 UNIT/ML DIALYSIS: 1000.0000 [IU] | INTRAMUSCULAR | Status: DC | PRN

## 2014-12-30 MED ORDER — MIDAZOLAM HCL 2 MG/2ML IJ SOLN
1.0000 mg | INTRAMUSCULAR | Status: DC | PRN
  Filled 2014-12-30: qty 2

## 2014-12-30 MED ORDER — INSULIN ASPART 100 UNIT/ML ~~LOC~~ SOLN
0.0000 [IU] | SUBCUTANEOUS | Status: DC
  Administered 2014-12-30: 2 [IU] via SUBCUTANEOUS
  Administered 2014-12-31 (×2): 1 [IU] via SUBCUTANEOUS
  Administered 2014-12-31: 2 [IU] via SUBCUTANEOUS
  Administered 2014-12-31 – 2015-01-01 (×2): 1 [IU] via SUBCUTANEOUS
  Administered 2015-01-01: 2 [IU] via SUBCUTANEOUS
  Administered 2015-01-01 – 2015-01-02 (×4): 1 [IU] via SUBCUTANEOUS

## 2014-12-30 MED ORDER — CETYLPYRIDINIUM CHLORIDE 0.05 % MT LIQD
7.0000 mL | Freq: Four times a day (QID) | OROMUCOSAL | Status: DC
  Administered 2014-12-30 – 2014-12-31 (×4): 7 mL via OROMUCOSAL

## 2014-12-30 MED ORDER — NEPRO/CARBSTEADY PO LIQD
237.0000 mL | ORAL | Status: DC | PRN
  Filled 2014-12-30: qty 237

## 2014-12-30 MED ORDER — MIDAZOLAM HCL 2 MG/2ML IJ SOLN
INTRAMUSCULAR | Status: AC
  Filled 2014-12-30: qty 2

## 2014-12-30 MED ORDER — SODIUM CHLORIDE 0.9 % IV SOLN
25.0000 ug/h | INTRAVENOUS | Status: DC
  Administered 2014-12-30: 75 ug/h via INTRAVENOUS
  Administered 2014-12-31: 275 ug/h via INTRAVENOUS
  Filled 2014-12-30 (×2): qty 50

## 2014-12-30 MED ORDER — INSULIN ASPART 100 UNIT/ML ~~LOC~~ SOLN
10.0000 [IU] | Freq: Once | SUBCUTANEOUS | Status: AC
  Administered 2014-12-30: 10 [IU] via SUBCUTANEOUS
  Filled 2014-12-30: qty 1

## 2014-12-30 MED ORDER — FENTANYL CITRATE (PF) 100 MCG/2ML IJ SOLN
50.0000 ug | Freq: Once | INTRAMUSCULAR | Status: AC
  Administered 2014-12-30: 50 ug via INTRAVENOUS
  Filled 2014-12-30: qty 2

## 2014-12-30 MED ORDER — MIDAZOLAM HCL 2 MG/2ML IJ SOLN
1.0000 mg | INTRAMUSCULAR | Status: DC | PRN
  Administered 2014-12-30 – 2014-12-31 (×4): 1 mg via INTRAVENOUS
  Filled 2014-12-30 (×3): qty 2

## 2014-12-30 MED ORDER — SODIUM CHLORIDE 0.9 % IV SOLN: Freq: Once | INTRAVENOUS | Status: DC

## 2014-12-30 MED ORDER — SODIUM CHLORIDE 0.9 % IV SOLN
INTRAVENOUS | Status: DC
  Administered 2014-12-30 – 2014-12-31 (×2): via INTRAVENOUS

## 2014-12-30 NOTE — ED Notes (Signed)
Pt did not have any belongings.  Pt was covered in a sheet on arrival.

## 2014-12-30 NOTE — Procedures (Signed)
Central Venous Dialysis Catheter Insertion Procedure Note Kristi Fuentes [id number] 07-11-1946  Procedure: Insertion of Central Venous Catheter Indications: Dialysis need  Procedure Details Consent: Risks of procedure as well as the alternatives and risks of each were explained to the (patient/caregiver).  Consent for procedure obtained. Time Out: Verified patient identification, verified procedure, site/side was marked, verified correct patient position, special equipment/implants available, medications/allergies/relevent history reviewed, required imaging and test results available.  Performed  Maximum sterile technique was used including antiseptics, cap, gloves, gown, hand hygiene, mask and sheet. Skin prep: Chlorhexidine; local anesthetic administered A antimicrobial bonded/coated triple lumen catheter was placed in the right femoral vein due to patient being a dialysis patient using the Seldinger technique.  Evaluation Blood flow good Complications: No apparent complications Patient did tolerate procedure well. Chest X-ray ordered to verify placement.  CXR: pending.  U/S used in placement.  YACOUB,WESAM 12/30/2014, 3:46 PM

## 2014-12-30 NOTE — ED Notes (Signed)
MD at bedside. EDP HORTON

## 2014-12-30 NOTE — ED Notes (Signed)
Per EMS, Pt, from home, presents after being found in garage, naked, with hematoma to R eye.  Pt denies pain.  Pt is lethargic, but A & Ox4.  Pt's family found suicide notes.  Hx of pancreatic CA.  Pt is fully immobilized w/ c collar and LSB.

## 2014-12-30 NOTE — ED Provider Notes (Addendum)
CSN: [id number]     Arrival date & time 12/30/14  1023 History   First MD Initiated Contact with Patient 12/30/14 1026     Chief Complaint  Patient presents with  . Fall  . Altered Mental Status  . Head Injury  . Suicidal     (Consider location/radiation/quality/duration/timing/severity/associated sxs/prior Treatment) HPI  This is a 69 year old female with a history of diabetes, hypertension, chronic kidney disease, pancreatic cancer who presents after being found down in her garage. She was found naked with head trauma in her garage. She left multiple suicide notes. On my evaluation, patient is lethargic but oriented 2. She does not recall what led up to the events. She denies any ingestions but does admit to leaving suicide notes. She denies any pain anywhere. ABCs intact and vital signs were stable in route. She was boarded and collared.  Collateral information provided by husband and daughter.  Patient with hx of breast and pancreatic cancer.  Recurrence of pancreatic cancer in Jan 2016.  Investigating experimental treatments at OSH. Hx of depression.  No prior suicidal gestures.  Does see a psychiatrist.  Lower 5 caveat.  Past Medical History  Diagnosis Date  . Diabetes mellitus   . Hypertension   . Chronic kidney disease   . Kidney stones   . Allergy   . Pancreatic cancer     Chemotherapy  . Primary pancreatic neuroendocrine tumor 2013  . Blood transfusion without reported diagnosis   . Heart murmur   . Thyroid disease 01/2002    2 radiaoactive iodine for cancer  . PONV (postoperative nausea and vomiting)   . Hypothyroidism     had thyroid removed 2002  . Cyst (solitary) of breast   . Simple cyst of kidney     unsure of which side maybe on both kidney's  . Incisional hernia   . Anemia   . Depression     takes Lamictal daily  . Status post chemotherapy     pancreatic histrory  . Kidney failure     During chemotherapy for pancreatic cancer  . Breast cancer   .  Sarcoma     Right buttock  . Hernia     Abdominal   Past Surgical History  Procedure Laterality Date  . Buttock sarcoma  2000  . 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 Abdominal hysterectomy    . Total knee arthroplasty      left  . Lipoma excision  04/20/2011    left arm  . Cholecystectomy    . Spleenectomy    . Breast surgery    . Urethral stricture dilatation  [id number]  . Pressure ulcer buttock  2005    from position during knee surgery   . Breast lumpectomy with needle localization and axillary sentinel lymph node bx Right 05/30/2013    Procedure: BREAST LUMPECTOMY WITH NEEDLE LOCALIZATION AND AXILLARY SENTINEL LYMPH NODE BX;  Surgeon: Harl Bowie, MD;  Location: [facility];  Service: General;  Laterality: Right;   Family History  Problem Relation Age of Onset  . Diabetes Father   . Kidney disease Father   . Cancer Father     colon  . Cancer Maternal Grandmother     breast   History  Substance Use Topics  . Smoking status: Never Smoker   . Smokeless tobacco: Never Used     Comment: never used tobacco  . Alcohol Use: No   OB History    Gravida Para  Term Preterm AB TAB SAB Ectopic Multiple Living   1 1              Obstetric Comments   Menarche age 49, parity age 37, G29, P94, adopted daughters, BC x 3-4 years, No HRT     Review of Systems  Constitutional: Negative for fever.  Eyes: Negative for visual disturbance.  Respiratory: Negative for cough, chest tightness and shortness of breath.   Cardiovascular: Negative for chest pain.  Gastrointestinal: Negative for nausea, vomiting and abdominal pain.  Genitourinary: Negative for dysuria.  Musculoskeletal: Negative for back pain.  Skin: Positive for wound.  Neurological: Positive for speech difficulty and headaches.  Psychiatric/Behavioral: Positive for suicidal ideas. Negative for confusion.  All other systems reviewed and are negative.     Allergies  Metoclopramide hcl and  Ramipril  Home Medications   Prior to Admission medications   Medication Sig Start Date End Date Taking? Authorizing Provider  acetaminophen (TYLENOL) 500 MG tablet Take 500-1,000 mg by mouth every 6 (six) hours as needed for moderate pain or headache.   Yes Historical Provider, MD  amLODipine (NORVASC) 10 MG tablet Take 10 mg by mouth daily.   Yes Historical Provider, MD  atenolol (TENORMIN) 25 MG tablet Take 12.5 mg by mouth daily. Takes 1/2 pill to = 12.5 mg. daily   Yes Historical Provider, MD  calcium carbonate (TUMS EX) 750 MG chewable tablet Chew 1 tablet by mouth 2 (two) times daily.   Yes Historical Provider, MD  clonazePAM (KLONOPIN) 0.5 MG tablet Take 0.5 mg by mouth as needed for anxiety.   Yes Historical Provider, MD  insulin aspart (NOVOLOG) 100 UNIT/ML injection Inject 3 Units into the skin 2 (two) times daily. Takes before breakfast and lunch.   Yes Historical Provider, MD  insulin glargine (LANTUS) 100 UNIT/ML injection Inject 22 Units into the skin daily before supper.   Yes Historical Provider, MD  lamoTRIgine (LAMICTAL) 200 MG tablet Take 200 mg by mouth daily.    Yes Historical Provider, MD  levothyroxine (SYNTHROID, LEVOTHROID) 150 MCG tablet Take 150 mcg by mouth daily.     Yes Historical Provider, MD  loratadine (CLARITIN) 10 MG tablet Take 10 mg by mouth daily.     Yes Historical Provider, MD  pravastatin (PRAVACHOL) 80 MG tablet Take 80 mg by mouth daily.     Yes Historical Provider, MD  tamoxifen (NOLVADEX) 20 MG tablet Take 1 tablet (20 mg total) by mouth daily. 07/09/14  Yes Volanda Napoleon, MD  zolpidem (AMBIEN) 5 MG tablet Take 5-10 mg by mouth at bedtime as needed for sleep.   Yes Historical Provider, MD   BP 114/64 mmHg  Pulse 91  Temp(Src) 97.3 F (36.3 C) (Oral)  Resp 21  Ht 5\' 2"  (1.575 m)  Wt 150 lb (68.04 kg)  BMI 27.43 kg/m2  SpO2 100% Physical Exam  Constitutional:  Obvious head trauma, ABCs intact  HENT:  Head: Normocephalic.  Dried blood  noted over the right temporal region, hematoma over the right eye, oropharyngeal clear, mucous membranes dry  Eyes:  Pupils are slightly asymmetric right pupil 5 mm reactive, left pupil 4 mm reactive  Neck:  C-collar in place  Cardiovascular: Normal rate, regular rhythm and normal heart sounds.   Pulmonary/Chest: Effort normal and breath sounds normal. No respiratory distress. She has no wheezes.  Abdominal: Soft. Bowel sounds are normal. There is no tenderness. There is no rebound.  Musculoskeletal: She exhibits no edema.  Neurological: She is  alert.  Oriented 2, slurred speech noted, patient able to provide history, moves all 4 extremities spontaneously  Skin: Skin is warm and dry.  4 cm laceration just adjacent to the right eyebrow laterally, bleeding controlled  Psychiatric:  Tearful, flat affect  Nursing note and vitals reviewed.   ED Course  Procedures (including critical care time)  CRITICAL CARE Performed by: Merryl Hacker   Total critical care time: 75 min  Critical care time was exclusive of separately billable procedures and treating other patients.  Critical care was necessary to treat or prevent imminent or life-threatening deterioration.  Critical care was time spent personally by me on the following activities: development of treatment plan with patient and/or surrogate as well as nursing, discussions with consultants, evaluation of patient's response to treatment, examination of patient, obtaining history from patient or surrogate, ordering and performing treatments and interventions, ordering and review of laboratory studies, ordering and review of radiographic studies, pulse oximetry and re-evaluation of patient's condition.   LACERATION REPAIR Performed by: Merryl Hacker Authorized by: Merryl Hacker Consent: Verbal consent obtained. Risks and benefits: risks, benefits and alternatives were discussed Consent given by: patient Patient identity  confirmed: provided demographic data Prepped and Draped in normal sterile fashion Wound explored  Laceration Location: right eye  Laceration Length: 4cm  No Foreign Bodies seen or palpated  Irrigation method: syringe Amount of cleaning: standard  Skin closure: steristrips with dermabond   Patient tolerance: Patient tolerated the procedure well with no immediate complications.  Labs Review Labs Reviewed  CBC WITH DIFFERENTIAL/PLATELET - Abnormal; Notable for the following:    WBC 17.6 (*)    RBC 3.56 (*)    Hemoglobin 11.2 (*)    HCT 34.5 (*)    Neutrophils Relative % 93 (*)    Neutro Abs 16.3 (*)    Lymphocytes Relative 4 (*)    All other components within normal limits  COMPREHENSIVE METABOLIC PANEL - Abnormal; Notable for the following:    Potassium 6.0 (*)    CO2 16 (*)    Glucose, Bld 133 (*)    BUN 71 (*)    Creatinine, Ser 3.43 (*)    GFR calc non Af Amer 13 (*)    GFR calc Af Amer 15 (*)    Anion gap 19 (*)    All other components within normal limits  ACETAMINOPHEN LEVEL - Abnormal; Notable for the following:    Acetaminophen (Tylenol), Serum <10.0 (*)    All other components within normal limits  SALICYLATE LEVEL - Abnormal; Notable for the following:    Salicylate Lvl 50.5 (*)    All other components within normal limits  URINALYSIS, ROUTINE W REFLEX MICROSCOPIC - Abnormal; Notable for the following:    Hgb urine dipstick SMALL (*)    Ketones, ur 15 (*)    Protein, ur 100 (*)    All other components within normal limits  CARBOXYHEMOGLOBIN - Abnormal; Notable for the following:    Carboxyhemoglobin 0.4 (*)    All other components within normal limits  LACTIC ACID, PLASMA - Abnormal; Notable for the following:    Lactic Acid, Venous 2.4 (*)    All other components within normal limits  PROTIME-INR - Abnormal; Notable for the following:    Prothrombin Time 16.0 (*)    All other components within normal limits  CBG MONITORING, ED - Abnormal; Notable  for the following:    Glucose-Capillary 119 (*)    All other components within normal  limits  CBG MONITORING, ED - Abnormal; Notable for the following:    Glucose-Capillary 163 (*)    All other components within normal limits  URINE RAPID DRUG SCREEN (HOSP PERFORMED)  ETHANOL  URINE MICROSCOPIC-ADD ON  LACTIC ACID, PLASMA    Imaging Review Ct Head Wo Contrast  12/30/2014   CLINICAL DATA:  Fall. Hematoma over the right eye. Lethargy. Pancreatic cancer. Possible suicide attempt.  EXAM: CT HEAD WITHOUT CONTRAST  CT CERVICAL SPINE WITHOUT CONTRAST  TECHNIQUE: Multidetector CT imaging of the head and cervical spine was performed following the standard protocol without intravenous contrast. Multiplanar CT image reconstructions of the cervical spine were also generated.  COMPARISON:  CT head and cervical spine 08/07/2009  FINDINGS: CT HEAD FINDINGS  Mild atrophy is within normal limits for age. No acute infarct, hemorrhage, or mass lesion is present.  A right periorbital hematoma and soft tissue swelling is present. There is no underlying fracture. The paranasal sinuses and mastoid air cells are clear. The calvarium is intact.  CT CERVICAL SPINE FINDINGS  Acquired fusion across the disc space and posterior elements at C3-4 is stable. Degenerative changes are present throughout the cervical spine. Grade 1 anterolisthesis at C6-7 is stable. No acute fracture or traumatic subluxation is evident.  The soft tissues of the neck are unremarkable. The lung apices are clear.  IMPRESSION: 1. Right periorbital hematoma and soft tissue swelling without an underlying fracture. 2. Normal CT appearance of the brain. 3. Mild multilevel degenerative changes in the cervical spine are similar to the prior study. 4. Acquired fusion at C3-4 is stable. 5. No acute fracture or traumatic subluxation within the cervical spine.   Electronically Signed   By: San Morelle M.D.   On: 12/30/2014 11:51   Ct Cervical Spine Wo  Contrast  12/30/2014   CLINICAL DATA:  Fall. Hematoma over the right eye. Lethargy. Pancreatic cancer. Possible suicide attempt.  EXAM: CT HEAD WITHOUT CONTRAST  CT CERVICAL SPINE WITHOUT CONTRAST  TECHNIQUE: Multidetector CT imaging of the head and cervical spine was performed following the standard protocol without intravenous contrast. Multiplanar CT image reconstructions of the cervical spine were also generated.  COMPARISON:  CT head and cervical spine 08/07/2009  FINDINGS: CT HEAD FINDINGS  Mild atrophy is within normal limits for age. No acute infarct, hemorrhage, or mass lesion is present.  A right periorbital hematoma and soft tissue swelling is present. There is no underlying fracture. The paranasal sinuses and mastoid air cells are clear. The calvarium is intact.  CT CERVICAL SPINE FINDINGS  Acquired fusion across the disc space and posterior elements at C3-4 is stable. Degenerative changes are present throughout the cervical spine. Grade 1 anterolisthesis at C6-7 is stable. No acute fracture or traumatic subluxation is evident.  The soft tissues of the neck are unremarkable. The lung apices are clear.  IMPRESSION: 1. Right periorbital hematoma and soft tissue swelling without an underlying fracture. 2. Normal CT appearance of the brain. 3. Mild multilevel degenerative changes in the cervical spine are similar to the prior study. 4. Acquired fusion at C3-4 is stable. 5. No acute fracture or traumatic subluxation within the cervical spine.   Electronically Signed   By: San Morelle M.D.   On: 12/30/2014 11:51     EKG Interpretation   Date/Time:  Tuesday December 30 2014 12:29:56 EDT Ventricular Rate:  39 PR Interval:  222 QRS Duration: 164 QT Interval:  690 QTC Calculation: 556 R Axis:   -40 Text Interpretation:  Sinus bradycardia Borderline prolonged PR interval  RBBB and LAFB Probable left ventricular hypertrophy Lateral infarct, age  indeterminate Abnormal T, consider ischemia,  diffuse leads Confirmed by  HORTON  MD, COURTNEY ([id number]) on 12/30/2014 2:24:49 PM      MDM   Final diagnoses:  Salicylate overdose, intentional self-harm, initial encounter    Patient presents after being found down in her garage. Suicide notes at home. Initial evaluation she is oriented 2. Obvious head trauma. Lethargic but arousable with slurred speech. Denies any ingestion at this time. Suicide notes are vague but states things like "I Love You but I just can't take it anymore and please take care of my wonderful grandchildren."  Full workup initiated including CT head and neck, basic labwork, UDS, Tylenol, salicylate levels.  Initial vital signs are reassuring. Patient became abruptly bradycardic in the 40s. EKG shows sinus bradycardia with right bundle branch block. QRS is 168. Prior QRS 153.  Workup notable for acute kidney injury with creatinine at 3.4 potassium 6.0, BUN 71. Tylenol level negative. Salicylate level XCV. Per the patient's husband, she could've had access to aspirin. Suspect acute aspirin toxicity. Patient has become progressively more delirious. Patient given normal saline bolus with 1 amp of bicarbonate.  Per poison control, will given additional 2 A of bicarbonate and start a bicarbonate drip of D5W with 3 A of bicarbonate. We'll add a serum lactate and PT/INR.  Given potassium of 6.0, patient given insulin, glucose, and calcium gluconate given EKG QRS widening. Patient will likely need emergent dialysis. Per poison control, if patient requires intubation, will need to match the native respiratory rate.  Spoke with Dr. Posey Pronto, [place] [facility] who will assess the patient for dialysis. Discussed with Dr. Shawna Orleans critical care who is accepted the patient to the critical care service.  Per discussions with the patient's family, while the patient was terminal pancreatic cancer, she was still highly functional and without significant pain. Likely had several more months to live.  Was not a hospice candidate and was not DO NOT RESUSCITATE prior to this suicidal gesture. Her son is a doctor and familiar with psychiatric illness. He feels that she may benefit if she recovers from this with psychiatric treatment and lived for several more months. Daughter also reports that patient was questioned by hospice nurse regarding need for elective intubation for things such as pneumonia and recoverable illness and the patient stated that she would be open for intubation at that time. At this time patient is full code.    Merryl Hacker, MD 12/30/14 [place], MD 12/30/14 2043

## 2014-12-30 NOTE — Procedures (Signed)
Intubation Procedure Note Kristi Fuentes [id number] [birth year]  Procedure: Intubation Indications: Respiratory insufficiency  Procedure Details Consent: Unable to obtain consent because of emergent medical necessity. Time Out: Verified patient identification, verified procedure, site/side was marked, verified correct patient position, special equipment/implants available, medications/allergies/relevent history reviewed, required imaging and test results available.  Performed  Maximum sterile technique was used including gloves, gown, hand hygiene, mask and sheet.  MAC and 3    Evaluation Hemodynamic Status: BP stable throughout; O2 sats: stable throughout Patient's Current Condition: stable Complications: No apparent complications Patient did tolerate procedure well. Chest X-ray ordered to verify placement.  CXR: pending.   Kristi Fuentes 12/30/2014  Aspirated, vomiting, change IN MS Emergent ETT Glide with emily RT I performed with RT  Lavon Paganini. Titus Mould, MD, [place] Pgr: [facility]

## 2014-12-30 NOTE — ED Notes (Signed)
Witnessed in and out catheter with NT [employer]

## 2014-12-30 NOTE — ED Notes (Signed)
Patient transported to CT 

## 2014-12-30 NOTE — H&P (Signed)
PULMONARY / CRITICAL CARE MEDICINE   Name: Kristi Fuentes MRN: [id number] DOB: Sep 29, 1945    ADMISSION DATE:  12/30/2014 CONSULTATION DATE:  12/30/14  REFERRING MD :  Dr. 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 Marin Olp, on tamoxifen only, Husband refused for her to get Herceptin, chemo or radiation per Dr. Dicie Beam notes - "they would not help her") who presented to the [facility] on 4/26 with an aspirin overdose.  Dr. Dicie Beam last note in 07/2014 mentions an element of depression. Her husband called and then canceled her February 2016 appt.    The patient reportedly was found outside in her garage naked, altered with facial trauma.   She had apparently left multiple suicide notes.  On initial ER evaluation, she was noted to be lethargic but oriented and did not recall events that led up to the situation.  She denied any ingestions but admitted to leaving suicide notes.  CT of the head and neck were assessed and negative for acute injury.  She became lethargic in the ER.   PAST MEDICAL HISTORY :   has a past medical history of Diabetes mellitus; Hypertension; Chronic kidney disease; Kidney stones; Allergy; Pancreatic cancer; Primary pancreatic neuroendocrine tumor (2013); Blood transfusion without reported diagnosis; Heart murmur; Thyroid disease  (01/2002); PONV (postoperative nausea and vomiting); Hypothyroidism; Cyst (solitary) of breast; Simple cyst of kidney; Incisional hernia; Anemia; Depression; Status post chemotherapy; Kidney failure; Breast cancer; Sarcoma; and Hernia.  has past surgical history that includes buttock sarcoma (2000); Thyroidectomy (07/30/2001); Abdominal hysterectomy; Total knee arthroplasty; Lipoma excision (04/20/2011); Cholecystectomy; spleenectomy; Breast surgery; Urethra dilation ([id number]); pressure ulcer buttock (2005); and Breast lumpectomy with needle localization and axillary sentinel lymph node bx (Right, 05/30/2013).    Prior to Admission medications   Medication Sig Start Date End Date Taking? Authorizing Provider  acetaminophen (TYLENOL) 500 MG tablet Take 500-1,000 mg by mouth every 6 (six) hours as needed for moderate pain or headache.   Yes Historical Provider, MD  amLODipine (NORVASC) 10 MG tablet Take 10 mg by mouth daily.   Yes Historical Provider, MD  atenolol (TENORMIN) 25 MG tablet Take 12.5 mg by mouth daily. Takes 1/2 pill to = 12.5 mg. daily   Yes Historical Provider, MD  calcium carbonate (TUMS EX) 750 MG chewable tablet Chew 1 tablet by mouth 2 (two) times daily.   Yes Historical Provider, MD  clonazePAM (KLONOPIN) 0.5 MG tablet Take 0.5 mg by mouth as needed for anxiety.   Yes Historical Provider, MD  insulin aspart (NOVOLOG) 100 UNIT/ML injection Inject 3 Units into the skin 2 (two) times daily. Takes before breakfast and lunch.   Yes Historical Provider, MD  insulin glargine (LANTUS) 100 UNIT/ML injection Inject 22 Units into the skin daily before supper.   Yes Historical Provider, MD  lamoTRIgine (LAMICTAL) 200 MG tablet Take 200 mg  by mouth daily.    Yes Historical Provider, MD  levothyroxine (SYNTHROID, LEVOTHROID) 150 MCG tablet Take 150 mcg by mouth daily.     Yes Historical Provider, MD  loratadine (CLARITIN) 10 MG tablet Take 10 mg by mouth daily.     Yes Historical Provider, MD   pravastatin (PRAVACHOL) 80 MG tablet Take 80 mg by mouth daily.     Yes Historical Provider, MD  tamoxifen (NOLVADEX) 20 MG tablet Take 1 tablet (20 mg total) by mouth daily. 07/09/14  Yes Volanda Napoleon, MD  zolpidem (AMBIEN) 5 MG tablet Take 5-10 mg by mouth at bedtime as needed for sleep.   Yes Historical Provider, MD   Allergies  Allergen Reactions  . Metoclopramide Hcl     Tardive dyskinesia-type symptoms  . Ramipril Cough    FAMILY HISTORY:  indicated that her mother is deceased. She indicated that her father is deceased.    SOCIAL HISTORY:  reports that she has never smoked. She has never used smokeless tobacco. She reports that she does not drink alcohol or use illicit drugs.  REVIEW OF SYSTEMS:  Unable to assess as patient is altered.   SUBJECTIVE: unresponsive.  VITAL SIGNS: Temp:  [97.3 F (36.3 C)] 97.3 F (36.3 C) (04/26 1110) Pulse Rate:  [72-74] 72 (04/26 1110) Resp:  [18] 18 (04/26 1110) BP: (117-133)/(57-64) 117/57 mmHg (04/26 1110) SpO2:  [98 %-100 %] 100 % (04/26 1110) Weight:  [150 lb (68.04 kg)] 150 lb (68.04 kg) (04/26 1304)   HEMODYNAMICS:    VENTILATOR SETTINGS:     INTAKE / OUTPUT: No intake or output data in the 24 hours ending 12/30/14 1347  PHYSICAL EXAMINATION: General:  Chronically ill appearing female, unresponsive. Neuro:  Withdraws to pain but not command all 4 ext. HEENT:  [place]/AT, PERRL, EOM-I and MMM Cardiovascular:  RRR, Nl S1/S2, -M/R/G. Lungs:  CTA bilaterally. Abdomen:  Soft, NT, ND and +BS. Musculoskeletal:  -edema and -tenderness.. Skin:  Intact.  LABS:  CBC  Recent Labs Lab 12/30/14 1122  WBC 17.6*  HGB 11.2*  HCT 34.5*  PLT 323   Coag's No results for input(s): APTT, INR in the last 168 hours.   BMET  Recent Labs Lab 12/30/14 1122  NA 142  K 6.0*  CL 107  CO2 16*  BUN 71*  CREATININE 3.43*  GLUCOSE 133*   Electrolytes  Recent Labs Lab 12/30/14 1122  CALCIUM 8.9   Sepsis Markers No results  for input(s): LATICACIDVEN, PROCALCITON, O2SATVEN in the last 168 hours.   ABG No results for input(s): PHART, PCO2ART, PO2ART in the last 168 hours.   Liver Enzymes  Recent Labs Lab 12/30/14 1122  AST 34  ALT 26  ALKPHOS 78  BILITOT 0.4  ALBUMIN 3.9   Cardiac Enzymes No results for input(s): TROPONINI, PROBNP in the last 168 hours.   Glucose  Recent Labs Lab 12/30/14 1125 12/30/14 1318  GLUCAP 119* 163*    Imaging No results found.   ASSESSMENT / PLAN:  NEUROLOGIC A:   Aspirin Overdose - initial salicylate level 95 P:   RASS goal: 0 Q2 hour salicylate level x4 Nephrology consult for HD Assess ABG now  PULMONARY OETT 4/26>>> A: Expect Respiratory Alkalosis  P:   ABG noted, mixed alkalosis. Intubate. Full vent support with high RR.  CARDIOVASCULAR CVL R femoral trialysis catheter 5/18>>> A:  Hx Diastolic Dysfunction, HTN, HLD P: Hold home medications. If Bp drops will consider pressors.  RENAL A:   Acute on  Chronic Kidney Injury - baseline sr cr ~ 2.3 Hyperkalemia  AG Metabolic Acidosis  P:   HD per Nephrology for ASA removal  Place HD catheter.  GASTROINTESTINAL A:  Pancreatic cancer by history. P:   OGT TF if not extubated by AM.  HEMATOLOGIC A:   Anemia  Leukocytosis  P:  Trend CBC   INFECTIOUS A:  No active issues. P:   Monitor WBC and fever curve.  ENDOCRINE A:   Diabetes Mellitus  Hypothyroidism  P:   CBG ISS  ONCOLOGY  A: Primary Pancreatic Neuroendocrine Tumor - s/p resection in 2013, per notes has had "clean scans" Stage IB (T1b, N0, M0) triple positive invasive ductal carcinoma of the R breast P:    FAMILY  - Updates: Family updated bedside.  LCB no CPR/cardioversion/trach/peg.  The patient is critically ill with multiple organ systems failure and requires high complexity decision making for assessment and support, frequent evaluation and titration of therapies, application of advanced monitoring  technologies and extensive interpretation of multiple databases.   Critical Care Time devoted to patient care services described in this note is  35  Minutes. This time reflects time of care of this signee Dr Jennet Maduro. This critical care time does not reflect procedure time, or teaching time or supervisory time of PA/NP/Med student/Med Resident etc but could involve care discussion time.  Rush Farmer, M.D. [facility] Pulmonary/Critical Care Medicine. Pager: [phone]. After hours pager: [phone].  12/30/2014, 1:47 PM

## 2014-12-30 NOTE — Consult Note (Signed)
Asa Saunas Admit Date: 12/30/2014 12/30/2014 Rexene Agent Requesting Physician:  Nelda Marseille MD  Reason for Consult:  Salicylate Toxicity. CKD HPI:  69 year old female seen at the request of Dr. Nelda Marseille after being found to have elevated salicylate levels. Patient has stage I breast cancer as well as neuroendocrine pancreatic cancer. She presented after being found in the garage of her home, undressed, with a hematoma the right eye, and by report with suicide notes present. Toxicity workup demonstrated salicylate level 09.8. No acetaminophen present. Ethelyn glycol negative. She has chronic kidney disease with baseline serum creatinine of 2.3-2.6. Her creatinine upon presentation was 3.43 with a bicarbonate of 16 and anion gap of 19. She was placed on sodium bicarbonate infusion in the emergency room and transferred to [facility]. She is currently altered and unable to speak for herself. She is maintaining her airway and on room air. She is normotensive.   CREATININE (mg/dL)  Date Value  06/28/2013 2.1*   CREAT (mg/dl)  Date Value  07/09/2014 2.6*  01/08/2014 2.3*   CREATININE, SER (mg/dL)  Date Value  12/30/2014 3.43*  04/09/2014 2.29*  08/28/2013 2.21*  05/23/2013 2.09*  10/16/2011 1.30*  04/19/2011 1.46*  ]  ROS Balance of 12 systems is negative w/ exceptions as above  PMH  Past Medical History  Diagnosis Date  . Diabetes mellitus   . Hypertension   . Chronic kidney disease   . Kidney stones   . Allergy   . Pancreatic cancer     Chemotherapy  . Primary pancreatic neuroendocrine tumor 2013  . Blood transfusion without reported diagnosis   . Heart murmur   . Thyroid disease 01/2002    2 radiaoactive iodine for cancer  . PONV (postoperative nausea and vomiting)   . Hypothyroidism     had thyroid removed 2002  . Cyst (solitary) of breast   . Simple cyst of kidney     unsure of which side maybe on both kidney's  . Incisional hernia   . Anemia   . Depression      takes Lamictal daily  . Status post chemotherapy     pancreatic histrory  . Kidney failure     During chemotherapy for pancreatic cancer  . Breast cancer   . Sarcoma     Right buttock  . Hernia     Abdominal   PSH  Past Surgical History  Procedure Laterality Date  . Buttock sarcoma  2000  . 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 Abdominal hysterectomy    . Total knee arthroplasty      left  . Lipoma excision  04/20/2011    left arm  . Cholecystectomy    . Spleenectomy    . Breast surgery    . Urethral stricture dilatation  [id number]  . Pressure ulcer buttock  2005    from position during knee surgery   . Breast lumpectomy with needle localization and axillary sentinel lymph node bx Right 05/30/2013    Procedure: BREAST LUMPECTOMY WITH NEEDLE LOCALIZATION AND AXILLARY SENTINEL LYMPH NODE BX;  Surgeon: Harl Bowie, MD;  Location: [place];  Service: General;  Laterality: Right;   FH  Family History  Problem Relation Age of Onset  . Diabetes Father   . Kidney disease Father   . Cancer Father     colon  . Cancer Maternal Grandmother     breast   SH  reports that she has never smoked. She has  never used smokeless tobacco. She reports that she does not drink alcohol or use illicit drugs. Allergies  Allergies  Allergen Reactions  . Metoclopramide Hcl     Tardive dyskinesia-type symptoms  . Ramipril Cough   Home medications Prior to Admission medications   Medication Sig Start Date End Date Taking? Authorizing Provider  acetaminophen (TYLENOL) 500 MG tablet Take 500-1,000 mg by mouth every 6 (six) hours as needed for moderate pain or headache.   Yes Historical Provider, MD  amLODipine (NORVASC) 10 MG tablet Take 10 mg by mouth daily.   Yes Historical Provider, MD  atenolol (TENORMIN) 25 MG tablet Take 12.5 mg by mouth daily. Takes 1/2 pill to = 12.5 mg. daily   Yes Historical Provider, MD  calcium carbonate (TUMS EX) 750 MG chewable tablet  Chew 1 tablet by mouth 2 (two) times daily.   Yes Historical Provider, MD  clonazePAM (KLONOPIN) 0.5 MG tablet Take 0.5 mg by mouth as needed for anxiety.   Yes Historical Provider, MD  insulin aspart (NOVOLOG) 100 UNIT/ML injection Inject 3 Units into the skin 2 (two) times daily. Takes before breakfast and lunch.   Yes Historical Provider, MD  insulin glargine (LANTUS) 100 UNIT/ML injection Inject 22 Units into the skin daily before supper.   Yes Historical Provider, MD  lamoTRIgine (LAMICTAL) 200 MG tablet Take 200 mg by mouth daily.    Yes Historical Provider, MD  levothyroxine (SYNTHROID, LEVOTHROID) 150 MCG tablet Take 150 mcg by mouth daily.     Yes Historical Provider, MD  loratadine (CLARITIN) 10 MG tablet Take 10 mg by mouth daily.     Yes Historical Provider, MD  pravastatin (PRAVACHOL) 80 MG tablet Take 80 mg by mouth daily.     Yes Historical Provider, MD  tamoxifen (NOLVADEX) 20 MG tablet Take 1 tablet (20 mg total) by mouth daily. 07/09/14  Yes Volanda Napoleon, MD  zolpidem (AMBIEN) 5 MG tablet Take 5-10 mg by mouth at bedtime as needed for sleep.   Yes Historical Provider, MD    Current Medications Scheduled Meds: . hydrogen peroxide       Continuous Infusions: . sodium chloride    .  sodium bicarbonate  infusion 1000 mL 125 mL/hr at 12/30/14 1407   PRN Meds:.sodium chloride, albuterol, ondansetron (ZOFRAN) IV  CBC  Recent Labs Lab 12/30/14 1122  WBC 17.6*  NEUTROABS 16.3*  HGB 11.2*  HCT 34.5*  MCV 96.9  PLT 833   Basic Metabolic Panel  Recent Labs Lab 12/30/14 1122  NA 142  K 6.0*  CL 107  CO2 16*  GLUCOSE 133*  BUN 71*  CREATININE 3.43*  CALCIUM 8.9    Physical Exam  Blood pressure 114/64, pulse 91, temperature 97.3 F (36.3 C), temperature source Oral, resp. rate 21, height 5\' 2"  (1.575 m), weight 68.04 kg (150 lb), SpO2 100 %. GEN: somnolent, in C-[place]ar ENT: R periorbital hemaoma EYES: Eyes closes CV: RRR, MSM,  No rub PULM: normal RR ABD:  s/nt/nd SKIN: no rashes/lesions other than R eye EXT:no edema   Assessment/Plan 69 year old female with salicylate toxicity in the setting of acute on chronic renal insufficiency. Furthermore, she has altered mental status. Her elevated level in the setting of a low GFR with altered mental status are all indications for emergent hemodialysis.  1. Salicylate toxicity 1. Presentation value 95.6 2. Unclear when taken 3. AMS present 4. Plan for 4h HD, full tx; CRRT not indicated as less salcylate clearance 5. Repeat salicylate level post  HD, if remains elevated likely start CRRT 2. AoCKD 3. Hyeprkalemia: HD now 4. Metabolic Acidosis, inc AG: can stop alkalinization  5. AMS 6. Suicide Attempt 7. Breast Cancer 8. Neuroendocrine Pancreatic Cancer   Pearson Grippe MD [phone] pgr 12/30/2014, 3:16 PM

## 2014-12-30 NOTE — ED Notes (Signed)
MD at bedside. 

## 2014-12-30 NOTE — ED Notes (Signed)
Bed: WA09 Expected date:  Expected time:  Means of arrival:  Comments: EMS- 69yo F, found outside/naked, Hx of CA

## 2014-12-31 ENCOUNTER — Inpatient Hospital Stay (HOSPITAL_COMMUNITY): Payer: Medicare Other

## 2014-12-31 ENCOUNTER — Encounter: Payer: Self-pay | Admitting: Family Medicine

## 2014-12-31 DIAGNOSIS — T17998A Other foreign object in respiratory tract, part unspecified causing other injury, initial encounter: Secondary | ICD-10-CM

## 2014-12-31 DIAGNOSIS — Z4659 Encounter for fitting and adjustment of other gastrointestinal appliance and device: Secondary | ICD-10-CM | POA: Insufficient documentation

## 2014-12-31 DIAGNOSIS — T39012A Poisoning by aspirin, intentional self-harm, initial encounter: Principal | ICD-10-CM

## 2014-12-31 DIAGNOSIS — T17908A Unspecified foreign body in respiratory tract, part unspecified causing other injury, initial encounter: Secondary | ICD-10-CM | POA: Insufficient documentation

## 2014-12-31 LAB — GLUCOSE, CAPILLARY
GLUCOSE-CAPILLARY: 117 mg/dL — AB (ref 70–99)
GLUCOSE-CAPILLARY: 122 mg/dL — AB (ref 70–99)
GLUCOSE-CAPILLARY: 145 mg/dL — AB (ref 70–99)
GLUCOSE-CAPILLARY: 167 mg/dL — AB (ref 70–99)
Glucose-Capillary: 118 mg/dL — ABNORMAL HIGH (ref 70–99)
Glucose-Capillary: 112 mg/dL — ABNORMAL HIGH (ref 70–99)

## 2014-12-31 LAB — BASIC METABOLIC PANEL
Anion gap: 9 (ref 5–15)
BUN: 15 mg/dL (ref 6–23)
CO2: 33 mmol/L — ABNORMAL HIGH (ref 19–32)
Calcium: 6.9 mg/dL — ABNORMAL LOW (ref 8.4–10.5)
Chloride: 95 mmol/L — ABNORMAL LOW (ref 96–112)
Creatinine, Ser: 1.81 mg/dL — ABNORMAL HIGH (ref 0.50–1.10)
GFR calc Af Amer: 32 mL/min — ABNORMAL LOW (ref >90)
GFR calc non Af Amer: 27 mL/min — ABNORMAL LOW (ref >90)
Glucose, Bld: 128 mg/dL — ABNORMAL HIGH (ref 70–99)
POTASSIUM: 3.2 mmol/L — AB (ref 3.5–5.1)
SODIUM: 137 mmol/L (ref 135–145)

## 2014-12-31 LAB — URINALYSIS, ROUTINE W REFLEX MICROSCOPIC
Bilirubin Urine: NEGATIVE
Glucose, UA: NEGATIVE mg/dL
KETONES UR: 15 mg/dL — AB
LEUKOCYTES UA: NEGATIVE
NITRITE: NEGATIVE
Protein, ur: 100 mg/dL — AB
Specific Gravity, Urine: 1.011 (ref 1.005–1.030)
UROBILINOGEN UA: 0.2 mg/dL (ref 0.0–1.0)
pH: 7 (ref 5.0–8.0)

## 2014-12-31 LAB — CBC
HEMATOCRIT: 24.7 % — AB (ref 36.0–46.0)
HEMOGLOBIN: 8.3 g/dL — AB (ref 12.0–15.0)
MCH: 31.8 pg (ref 26.0–34.0)
MCHC: 33.6 g/dL (ref 30.0–36.0)
MCV: 94.6 fL (ref 78.0–100.0)
PLATELETS: 247 10*3/uL (ref 150–400)
RBC: 2.61 MIL/uL — ABNORMAL LOW (ref 3.87–5.11)
RDW: 13.7 % (ref 11.5–15.5)
WBC: 17.2 10*3/uL — AB (ref 4.0–10.5)

## 2014-12-31 LAB — SALICYLATE LEVEL
SALICYLATE LVL: 13 mg/dL (ref 2.8–20.0)
SALICYLATE LVL: 19.1 mg/dL (ref 2.8–20.0)
SALICYLATE LVL: 19.4 mg/dL (ref 2.8–20.0)
Salicylate Lvl: 11.7 mg/dL (ref 2.8–20.0)

## 2014-12-31 LAB — URINE MICROSCOPIC-ADD ON

## 2014-12-31 LAB — POCT I-STAT 3, ART BLOOD GAS (G3+)
ACID-BASE EXCESS: 9 mmol/L — AB (ref 0.0–2.0)
Bicarbonate: 33.5 mEq/L — ABNORMAL HIGH (ref 20.0–24.0)
O2 Saturation: 99 %
PCO2 ART: 46.7 mmHg — AB (ref 35.0–45.0)
Patient temperature: 98.4
TCO2: 35 mmol/L (ref 0–100)
pH, Arterial: 7.463 — ABNORMAL HIGH (ref 7.350–7.450)
pO2, Arterial: 137 mmHg — ABNORMAL HIGH (ref 80.0–100.0)

## 2014-12-31 LAB — HEPATITIS B SURFACE ANTIGEN: Hepatitis B Surface Ag: NEGATIVE

## 2014-12-31 LAB — HEPATITIS B SURFACE ANTIBODY,QUALITATIVE: Hep B S Ab: NONREACTIVE

## 2014-12-31 LAB — TSH: TSH: 0.866 u[IU]/mL (ref 0.350–4.500)

## 2014-12-31 LAB — HEPATITIS B CORE ANTIBODY, TOTAL: HEP B C TOTAL AB: NEGATIVE

## 2014-12-31 MED ORDER — LEVOTHYROXINE SODIUM 150 MCG PO TABS
150.0000 ug | ORAL_TABLET | Freq: Every day | ORAL | Status: DC
  Administered 2015-01-03 – 2015-01-04 (×2): 150 ug via ORAL
  Filled 2014-12-31 (×9): qty 1

## 2014-12-31 MED ORDER — LAMOTRIGINE 200 MG PO TABS
200.0000 mg | ORAL_TABLET | Freq: Every day | ORAL | Status: DC
  Administered 2014-12-31 – 2015-01-05 (×5): 200 mg via ORAL
  Filled 2014-12-31 (×7): qty 1

## 2014-12-31 MED ORDER — CETYLPYRIDINIUM CHLORIDE 0.05 % MT LIQD
7.0000 mL | Freq: Two times a day (BID) | OROMUCOSAL | Status: DC
  Administered 2014-12-31 – 2015-01-04 (×6): 7 mL via OROMUCOSAL

## 2014-12-31 MED ORDER — POTASSIUM CHLORIDE 10 MEQ/100ML IV SOLN
10.0000 meq | INTRAVENOUS | Status: AC
  Administered 2014-12-31 (×3): 10 meq via INTRAVENOUS
  Filled 2014-12-31 (×3): qty 100

## 2014-12-31 MED ORDER — POTASSIUM CHLORIDE 10 MEQ/100ML IV SOLN: 10.0000 meq | INTRAVENOUS | Status: DC

## 2014-12-31 MED ORDER — HEPARIN SODIUM (PORCINE) 5000 UNIT/ML IJ SOLN
5000.0000 [IU] | Freq: Three times a day (TID) | INTRAMUSCULAR | Status: DC
  Administered 2014-12-31 – 2015-01-04 (×14): 5000 [IU] via SUBCUTANEOUS
  Filled 2014-12-31 (×17): qty 1

## 2014-12-31 MED ORDER — PANTOPRAZOLE SODIUM 40 MG IV SOLR
40.0000 mg | INTRAVENOUS | Status: DC
  Administered 2014-12-31: 40 mg via INTRAVENOUS
  Filled 2014-12-31 (×3): qty 40

## 2014-12-31 NOTE — Progress Notes (Signed)
UR Completed.  [phone]  

## 2014-12-31 NOTE — Procedures (Signed)
Extubation Procedure Note  Patient Details:   Name: Kristi Fuentes DOB: 1945-12-25 MRN: [id number]   Airway Documentation:     Evaluation  O2 sats: stable throughout and currently acceptable Complications: No apparent complications Patient did tolerate procedure well. Bilateral Breath Sounds: Clear Suctioning: Airway Pt refused to speak post-extubation.  Prior to extubation:  Pt suctioned orally, via subglottic and ETT.  Positive for a cuff leak.   Post-extubation:  No stridor noted.  [name] 12/31/2014, 10:55 AM

## 2014-12-31 NOTE — Progress Notes (Signed)
PULMONARY / CRITICAL CARE MEDICINE   Name: Kristi Fuentes MRN: [id number] DOB: 1946-02-10    ADMISSION DATE:  12/30/2014 CONSULTATION DATE:  12/30/14  REFERRING MD :  Dr. 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 Marin Olp, on tamoxifen only, who presented to the [place] ER on 4/26 with an aspirin overdose from suicide attempt and fall. Patient was intubated for airway protection.  STUDIES:  4/26  CT Head / Cervical Spine >> R periorbital hematoma & soft tissue swelling without fx, no acute intracranial process, degenerative changes of cervical spine, acquired fusion of C3-4, no acute fx of cervical spine 4/26  UDS >> negative, ETOH >> neg CXR 4/27>> Interim placement of NG tube. Its tip is in the lower chest, presumably in the lower esophagus. Further advanced min is suggested. Endotracheal tube in stable position. Mild cardiomegaly with mild pulmonary vascular prominence. Low lung volumes with basilar atelectasis  LINES: R Femoral HD cath 4/26>> OETT 4/26>> OGT 4/26>>  ANTIBIOTICS: NONE  CULTURES: NONE  SIGNIFICANT EVENTS: 4/26  Admit with ASA OD 4/26>> Intubated, emergent HD  SUBJECTIVE: Intubated, sedated, agitated.  VITAL SIGNS: Temp:  [97.3 F (36.3 C)-98.7 F (37.1 C)] 98.5 F (36.9 C) (04/27 0748) Pulse Rate:  [55-91] 68 (04/27 0800) Resp:  [7-25] 15 (04/27 0800) BP: (71-140)/(34-83) 114/51 mmHg (04/27 0800) SpO2:  [96 %-100 %] 100 % (04/27 0800) FiO2 (%):  [40 %] 40 % (04/27 0800) Weight:  [150 lb (68.04 kg)-164 lb 7.4 oz (74.6 kg)] 164 lb 7.4 oz (74.6 kg) (04/27 0500)      VENTILATOR SETTINGS: Vent Mode:  [-] PCV FiO2 (%):  [40 %] 40 % Set Rate:  [12 bmp] 12  bmp Vt Set:  [340 mL] 340 mL PEEP:  [5 cmH20] 5 cmH20   INTAKE / OUTPUT:  Intake/Output Summary (Last 24 hours) at 12/31/14 0908 Last data filed at 12/31/14 0800  Gross per 24 hour  Intake 5074.63 ml  Output  -1496 ml  Net 6570.63 ml   PHYSICAL EXAMINATION: General:  WDWN, restless. Neuro: RASS 0 to +1, moving all extremities, tracking with eyes HEENT:  Intubated, periorbital edema over right eye with laceration now sutured. Cardiovascular:  RRR Lungs:  CTA bilaterally Abdomen:  Soft, NTND and +BS. Musculoskeletal: No LEE b/l, wearing SCDs  LABS: CBC  Recent Labs Lab 12/30/14 1122 12/31/14 0440  WBC 17.6* 17.2*  HGB 11.2* 8.3*  HCT 34.5* 24.7*  PLT 323 247   Coag's  Recent Labs Lab 12/30/14 1127  INR 1.27    BMET  Recent Labs Lab 12/30/14 1122 12/30/14 1634 12/31/14 0440  NA 142 145 137  K 6.0* 4.4 3.2*  CL 107 104 95*  CO2 16* 18* 33*  BUN 71* 72* 15  CREATININE 3.43* 3.64* 1.81*  GLUCOSE 133* 125* 128*   Electrolytes  Recent Labs Lab 12/30/14 1122 12/30/14 1634 12/31/14 0440  CALCIUM 8.9 8.3* 6.9*   Sepsis Markers  Recent Labs Lab 12/30/14 1322 12/30/14 1941  LATICACIDVEN 2.4* 1.7    ABG  Recent Labs Lab 12/30/14 1804 12/30/14 2032 12/31/14 0405  PHART 7.517* 7.562* 7.463*  PCO2ART 30.3* 30.4* 46.7*  PO2ART 148.0* 121.0* 137.0*    Liver Enzymes  Recent Labs Lab 12/30/14  1122 12/30/14 2014  AST 34  --   ALT 26  --   ALKPHOS 78  --   BILITOT 0.4  --   ALBUMIN 3.9 3.0*    Glucose  Recent Labs Lab 12/30/14 1318 12/30/14 1605 12/30/14 2016 12/31/14 0022 12/31/14 0445 12/31/14 0728  GLUCAP 163* 107* 94 117* 118* 145*    Imaging Ct Head Wo Contrast  12/30/2014   CLINICAL DATA:  Fall. Hematoma over the right eye. Lethargy. Pancreatic cancer. Possible suicide attempt.  EXAM: CT HEAD WITHOUT CONTRAST  CT CERVICAL SPINE WITHOUT CONTRAST  TECHNIQUE: Multidetector CT imaging of the head and cervical spine was  performed following the standard protocol without intravenous contrast. Multiplanar CT image reconstructions of the cervical spine were also generated.  COMPARISON:  CT head and cervical spine 08/07/2009  FINDINGS: CT HEAD FINDINGS  Mild atrophy is within normal limits for age. No acute infarct, hemorrhage, or mass lesion is present.  A right periorbital hematoma and soft tissue swelling is present. There is no underlying fracture. The paranasal sinuses and mastoid air cells are clear. The calvarium is intact.  CT CERVICAL SPINE FINDINGS  Acquired fusion across the disc space and posterior elements at C3-4 is stable. Degenerative changes are present throughout the cervical spine. Grade 1 anterolisthesis at C6-7 is stable. No acute fracture or traumatic subluxation is evident.  The soft tissues of the neck are unremarkable. The lung apices are clear.  IMPRESSION: 1. Right periorbital hematoma and soft tissue swelling without an underlying fracture. 2. Normal CT appearance of the brain. 3. Mild multilevel degenerative changes in the cervical spine are similar to the prior study. 4. Acquired fusion at C3-4 is stable. 5. No acute fracture or traumatic subluxation within the cervical spine.   Electronically Signed   By: San Morelle M.D.   On: 12/30/2014 11:51   Ct Cervical Spine Wo Contrast  12/30/2014   CLINICAL DATA:  Fall. Hematoma over the right eye. Lethargy. Pancreatic cancer. Possible suicide attempt.  EXAM: CT HEAD WITHOUT CONTRAST  CT CERVICAL SPINE WITHOUT CONTRAST  TECHNIQUE: Multidetector CT imaging of the head and cervical spine was performed following the standard protocol without intravenous contrast. Multiplanar CT image reconstructions of the cervical spine were also generated.  COMPARISON:  CT head and cervical spine 08/07/2009  FINDINGS: CT HEAD FINDINGS  Mild atrophy is within normal limits for age. No acute infarct, hemorrhage, or mass lesion is present.  A right periorbital hematoma and  soft tissue swelling is present. There is no underlying fracture. The paranasal sinuses and mastoid air cells are clear. The calvarium is intact.  CT CERVICAL SPINE FINDINGS  Acquired fusion across the disc space and posterior elements at C3-4 is stable. Degenerative changes are present throughout the cervical spine. Grade 1 anterolisthesis at C6-7 is stable. No acute fracture or traumatic subluxation is evident.  The soft tissues of the neck are unremarkable. The lung apices are clear.  IMPRESSION: 1. Right periorbital hematoma and soft tissue swelling without an underlying fracture. 2. Normal CT appearance of the brain. 3. Mild multilevel degenerative changes in the cervical spine are similar to the prior study. 4. Acquired fusion at C3-4 is stable. 5. No acute fracture or traumatic subluxation within the cervical spine.   Electronically Signed   By: San Morelle M.D.   On: 12/30/2014 11:51   Portable Chest Xray  12/30/2014   CLINICAL DATA:  ET tube placement  EXAM: PORTABLE CHEST - 1 VIEW  COMPARISON:  None.  FINDINGS: There is elevation of the left diaphragm. There is no focal parenchymal opacity. There is no pleural effusion or pneumothorax.  Normal cardiomediastinal silhouette.  No acute osseus abnormality.  Endotracheal tube with the tip 4.5 cm above the carina.  IMPRESSION: Endotracheal tube with the tip 4.5 cm above the carina.   Electronically Signed   By: Kathreen Devoid   On: 12/30/2014 18:00   Dg Abd Portable 1v  12/31/2014   CLINICAL DATA:  Orogastric tube placement.  Initial encounter.  EXAM: PORTABLE ABDOMEN - 1 VIEW  COMPARISON:  Abdominal radiograph performed 10/16/2011  FINDINGS: The patient's enteric tube is noted ending overlying the distal esophagus. This should be advanced at least 10 cm.  The visualized bowel gas pattern is grossly unremarkable. Scattered air-filled loops of small and large bowel are seen, without evidence of bowel obstruction.  Mild degenerative change is noted  along the lumbar spine. No acute osseous abnormalities are seen.  IMPRESSION: Enteric tube noted ending overlying the distal esophagus. This should be advanced at least 10 cm, as deemed clinically appropriate.   Electronically Signed   By: Garald Balding M.D.   On: 12/31/2014 02:09   ASSESSMENT / PLAN:  NEUROLOGIC A:   Aspirin Overdose - initial salicylate level 16>10.9 after dialysis P:   RASS goal: 0 On fentanyl gtt and prn Versed prn for agitation  Nephrology following for HD last night, likely no more now that level normalized Consult Psych in future [place] [facility]  PULMONARY  OETT 4/26>>> A: Respiratory Alkalosis- improved on most recent ABG R/o aspiration R/o pulm edema related to sal tox P:   Intubated On Pressure Control Ventilation, abg reviewed, consider PC reduction Wean cpap 5ps 5, goal 1 hr, need WUA Assess cough, gag pcxr in am  Albuterol prn  CARDIOVASCULAR CVL R femoral trialysis catheter 6/04>>> A:  Hx Diastolic Dysfunction HTN HLD P: Hold home medications. Not requiring pressors Even balance goals  RENAL A:   Acute on Chronic Kidney Injury - baseline sr cr ~ 2.3>2.06 Hypokalemia AG Metabolic Acidosis  P:   HD per Nephrology for ASA removal  HD catheter in place Repeat CMET tomorrow am KCl 10 mEq x 3 3 amps Sodium Bicarb in D5 at 125 mL/hr -reduce to dc  GASTROINTESTINAL A:   Pancreatic cancer by history malplaced NGT P:   OGT Consider starting TF if not extubated  Dc NGT Protonix 40 mg IV QD  HEMATOLOGIC A:   Anemia, likely dilutional Leukocytosis 17.2 P:  Trend CBC  On SCDs Received Heparin with HD Consider starting Lovenox versus Heparin  INFECTIOUS A:   No active issues P:   Monitor WBC and fever curve  ENDOCRINE A:   Diabetes Mellitus  Hypothyroidism  P:   CBG Q4H SSI-S Consider restarting Levothyroxine- on 150 mcg daily at home Obtain tsh  ONCOLOGY  A: Primary Pancreatic Neuroendocrine Tumor - s/p resection  in 2013 Stage IB (T1b, N0, M0) triple positive invasive ductal carcinoma of the R breast on Tamoxitfen P: Contact Dr. Marin Olp  FAMILY  - Updates: Family updated bedside.  LCB no CPR/cardioversion/trach/peg.  Osa Craver, DO PGY-1 Internal Medicine Resident Pager # [phone] 12/31/2014 9:08 AM   STAFF NOTE: Linwood Dibbles, MD FACP have personally reviewed patient's available data, including medical history, events of note, physical examination and test results as part of my evaluation. I have discussed with resident/NP and other care providers such as pharmacist, RN and RRT. In addition, I personally evaluated patient and  elicited key findings of: neurostatus improved, coarse BS, sal limproved, dc bicarb, may need PC reduction, weaning cpap ps, add home lamictal, upright, even balance goakls, i updated family, pcxr needed in am  The patient is critically ill with multiple organ systems failure and requires high complexity decision making for assessment and support, frequent evaluation and titration of therapies, application of advanced monitoring technologies and extensive interpretation of multiple databases.   Critical Care Time devoted to patient care services described in this note is30 Minutes. This time reflects time of care of this signee: Merrie Roof, MD FACP. This critical care time does not reflect procedure time, or teaching time or supervisory time of PA/NP/Med student/Med Resident etc but could involve care discussion time. Rest per NP/medical resident whose note is outlined above and that I agree with   Lavon Paganini. Titus Mould, MD, [place] Pgr: [facility] 12/31/2014 9:45 AM

## 2014-12-31 NOTE — Progress Notes (Signed)
[facility] Ethics Consult Intake Form  Name of Ethics Committee member: Jackqulyn Livings Date/Time: 11:30 12/31/14 (late entry, call and discussion with Dr. on 12/30/14) Member taking information:  [place]  Name of person making initial contact: Dr. Alva Garnet Relationship/Role in patient's care: attending Name of person requesting consult (if different than contact person):  Same Relationship to the patient (i.e. attending, spouse, case manager etc.):  Same  Name, MR#: Valla, Pacey, MRN:  [id number]  Has palliative care consult been obtained/requested? no  Brief description of clinical/ethical question; include time constraints if clinically necessary:  Call at time of admission.  Appears to be overdose.   She has recurrence of pancreatic cancer in Jan 2016. Investigating experimental treatments at OSH.  Per Dr. Alva Garnet, the patient was reported to be prev functional and not actively dying from cancer.    At this point, it is reasonable for full supportive care.  I see three general scenarios.   1.  The patient actively declines to an irreversible point with pending death.  2.  The patient has enough recovery to be interviewed and potentially make informed decisions re: her care.  3.  The patient stabilizes but requires ongoing intensive treatment and/or cannot still make informed choices.    The first two options are relatively straightforward.  The third option would likely be more complicated.  If we can help with any of them, then please notify us.    In meantime, full supportive care is reasonable.    Disposition after initial phone contact: await f/u from inpatient team.   Total time spent on brief consult: 30 min.

## 2014-12-31 NOTE — Progress Notes (Signed)
Admit: 12/30/2014 LOS: 1  89M with salicylate toxicity and AoCKD with AMS, hypotension, and VDRF    Subjective:  HD overnight, post salicylate level 19 Intubated after aspiration    04/26 0701 - 04/27 0700 In: 4564.6 [I.V.:4564.6] Out: -1496 [Urine:565]  Filed Weights   12/30/14 1304 12/30/14 1700 12/31/14 0500  Weight: 68.04 kg (150 lb) 68.04 kg (150 lb) 74.6 kg (164 lb 7.4 oz)    Scheduled Meds: . sodium chloride   Intravenous Once  . antiseptic oral rinse  7 mL Mouth Rinse QID  . chlorhexidine  15 mL Mouth Rinse BID  . insulin aspart  0-9 Units Subcutaneous 6 times per day  . pantoprazole (PROTONIX) IV  40 mg Intravenous Q24H   Continuous Infusions: . sodium chloride 100 mL/hr at 12/31/14 0242  . fentaNYL infusion INTRAVENOUS 300 mcg/hr (12/31/14 0530)  .  sodium bicarbonate  infusion 1000 mL 125 mL/hr at 12/31/14 0500   PRN Meds:.sodium chloride, sodium chloride, sodium chloride, albuterol, feeding supplement (NEPRO CARB STEADY), fentaNYL, heparin, heparin, midazolam, midazolam, ondansetron (ZOFRAN) IV  Current Labs: reviewed    Physical Exam:  Blood pressure 97/46, pulse 61, temperature 98.5 F (36.9 C), temperature source Oral, resp. rate 18, height 5\' 1"  (1.549 m), weight 74.6 kg (164 lb 7.4 oz), SpO2 96 %. GEN:ETT in place, sedated ENT: R periorbital hematoma EYES: Eyes closed CV: RRR, MSM, No rub PULM: normal RR ABD: s/nt/nd SKIN: no rashes/lesions other than R eye EXT:no edema  A/P 1. Salicylate toxicity 1. Presentation value 95.6, down to 19 after single HD 4/26 2. Repeat value this AM, to make sure no rebound 3. No indication for further HD at this time 4. Suggest dc of NaHCO3 gtt as well 2. AoCKD: follow post HD, making some urine 3. Hyeprkalemia: resolved 4. Metabolic Acidosis, inc AG: can stop alkalinization  5. AMS 6. VDRF 4/26 after aspiration event 7. Suicide Attempt 8. Breast Cancer 9. Neuroendocrine Pancreatic Cancer  Pearson Grippe  MD 12/31/2014, 7:56 AM   Recent Labs Lab 12/30/14 1122 12/30/14 1634 12/31/14 0440  NA 142 145 137  K 6.0* 4.4 3.2*  CL 107 104 95*  CO2 16* 18* 33*  GLUCOSE 133* 125* 128*  BUN 71* 72* 15  CREATININE 3.43* 3.64* 1.81*  CALCIUM 8.9 8.3* 6.9*    Recent Labs Lab 12/30/14 1122 12/31/14 0440  WBC 17.6* 17.2*  NEUTROABS 16.3*  --   HGB 11.2* 8.3*  HCT 34.5* 24.7*  MCV 96.9 94.6  PLT 323 247

## 2015-01-01 ENCOUNTER — Inpatient Hospital Stay (HOSPITAL_COMMUNITY): Payer: Medicare Other

## 2015-01-01 DIAGNOSIS — T39091A Poisoning by salicylates, accidental (unintentional), initial encounter: Secondary | ICD-10-CM

## 2015-01-01 DIAGNOSIS — Z4659 Encounter for fitting and adjustment of other gastrointestinal appliance and device: Secondary | ICD-10-CM | POA: Insufficient documentation

## 2015-01-01 LAB — CBC
HCT: 27.5 % — ABNORMAL LOW (ref 36.0–46.0)
Hemoglobin: 8.8 g/dL — ABNORMAL LOW (ref 12.0–15.0)
MCH: 31.1 pg (ref 26.0–34.0)
MCHC: 32 g/dL (ref 30.0–36.0)
MCV: 97.2 fL (ref 78.0–100.0)
PLATELETS: 256 10*3/uL (ref 150–400)
RBC: 2.83 MIL/uL — ABNORMAL LOW (ref 3.87–5.11)
RDW: 13.9 % (ref 11.5–15.5)
WBC: 16.4 10*3/uL — ABNORMAL HIGH (ref 4.0–10.5)

## 2015-01-01 LAB — COMPREHENSIVE METABOLIC PANEL
ALT: 32 U/L (ref 0–35)
AST: 51 U/L — ABNORMAL HIGH (ref 0–37)
Albumin: 2.9 g/dL — ABNORMAL LOW (ref 3.5–5.2)
Alkaline Phosphatase: 60 U/L (ref 39–117)
Anion gap: 14 (ref 5–15)
BUN: 29 mg/dL — ABNORMAL HIGH (ref 6–23)
CHLORIDE: 94 mmol/L — AB (ref 96–112)
CO2: 31 mmol/L (ref 19–32)
Calcium: 6.8 mg/dL — ABNORMAL LOW (ref 8.4–10.5)
Creatinine, Ser: 3.15 mg/dL — ABNORMAL HIGH (ref 0.50–1.10)
GFR calc non Af Amer: 14 mL/min — ABNORMAL LOW (ref >90)
GFR, EST AFRICAN AMERICAN: 16 mL/min — AB (ref >90)
Glucose, Bld: 149 mg/dL — ABNORMAL HIGH (ref 70–99)
Potassium: 3.5 mmol/L (ref 3.5–5.1)
Sodium: 139 mmol/L (ref 135–145)
Total Bilirubin: 0.5 mg/dL (ref 0.3–1.2)
Total Protein: 5.4 g/dL — ABNORMAL LOW (ref 6.0–8.3)

## 2015-01-01 LAB — BLOOD GAS, ARTERIAL

## 2015-01-01 LAB — GLUCOSE, CAPILLARY
GLUCOSE-CAPILLARY: 135 mg/dL — AB (ref 70–99)
Glucose-Capillary: 115 mg/dL — ABNORMAL HIGH (ref 70–99)
Glucose-Capillary: 136 mg/dL — ABNORMAL HIGH (ref 70–99)
Glucose-Capillary: 138 mg/dL — ABNORMAL HIGH (ref 70–99)
Glucose-Capillary: 139 mg/dL — ABNORMAL HIGH (ref 70–99)
Glucose-Capillary: 154 mg/dL — ABNORMAL HIGH (ref 70–99)

## 2015-01-01 MED ORDER — CLONAZEPAM 0.5 MG PO TABS: 0.5000 mg | ORAL_TABLET | Freq: Every day | ORAL | Status: DC | PRN

## 2015-01-01 MED ORDER — POTASSIUM CHLORIDE CRYS ER 20 MEQ PO TBCR
40.0000 meq | EXTENDED_RELEASE_TABLET | Freq: Once | ORAL | Status: DC
  Filled 2015-01-01 (×2): qty 2

## 2015-01-01 MED ORDER — ATENOLOL 12.5 MG HALF TABLET
12.5000 mg | ORAL_TABLET | Freq: Every day | ORAL | Status: DC
  Administered 2015-01-01 – 2015-01-04 (×4): 12.5 mg via ORAL
  Filled 2015-01-01 (×6): qty 1

## 2015-01-01 MED ORDER — SODIUM CHLORIDE 0.9 % IV SOLN: 250.0000 mL | INTRAVENOUS | Status: DC | PRN

## 2015-01-01 MED ORDER — SODIUM CHLORIDE 0.9 % IV SOLN: 250.0000 mL | INTRAVENOUS | Status: DC

## 2015-01-01 MED ORDER — SODIUM CHLORIDE 0.9 % IV SOLN
INTRAVENOUS | Status: DC
  Administered 2015-01-01: via INTRAVENOUS
  Administered 2015-01-01: 75 mL/h via INTRAVENOUS
  Administered 2015-01-02 – 2015-01-03 (×2): via INTRAVENOUS

## 2015-01-01 NOTE — Progress Notes (Signed)
Called to receive report x2

## 2015-01-01 NOTE — Progress Notes (Signed)
Chaplain called by RN when patient requested priest.   RN offered chaplain services at late hour. Lengthy session of conversation, hopes and dreams, laced with humor.  Ended session with prayer.  Chaplain will call [facility] Thursday morning to report patient's wish to see priest.  Rev. [place], [place]

## 2015-01-01 NOTE — Consult Note (Signed)
[facility] Face-to-Face Psychiatry Consult   Reason for Consult:  Depression and status post aspirin overdose Referring Physician:  Dr. Alva Garnet / Dr. Marvel Plan Patient Identification: Kristi Fuentes MRN:  [id number] Principal Diagnosis: Salicylate overdose; bipolar depression Diagnosis:   Patient Active Problem List   Diagnosis Date Noted  . Aspiration into airway [T17.998A]   . Encounter for orogastric (OG) tube placement [Z46.59]   . Salicylate overdose [X90.240X] 12/30/2014  . Aspirin overdose [T39.011A] 12/30/2014  . Carcinoma of central portion of female breast [C50.119] 06/21/2013  . Breast cancer, right breast [C50.911] 05/09/2013  . Arm mass [R22.30] 04/11/2011  . 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 female patient admitted with aspirin overdose and depression.  HPI: Kristi Fuentes is a 69 years old female admitted to [facility] with altered mental status and status post suicidal attempt by taking aspirin 325 mg 83 and also made a suicide note. Patient required intubation on arrival and recently extubated and contacted psychiatric consultation liaison for psychiatric assessment of depression, suicidal ideations, intention or plan. Patient was found by her husband and her car garage. Patient was not able to identify triggers for a increased depression and suicidal ideation. Reportedly she has been feeling depressed, sad, alone, isolated, with drawn and also guarded about providing information at this time. Patient reported she has been taking her medication as prescribed by Dr. Darleen Crocker at [facility] and also gave consent for coordination of care. Patient stated she has suicidal thoughts but she does not want tell anybody and felt her psychiatrist is going to get angry. Patient reportedly had one previous suicidal attempt about 40 years ago and that  times was diagnosed with postpartum depression. Patient also has a multiple medical problems as listed below.  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 Marin Olp, on tamoxifen only, Husband refused for her to get Herceptin, chemo or radiation per Dr. Dicie Beam notes - "they would not help her") who presented to the [facility] on 4/26 with an aspirin overdose. Dr. Dicie Beam last note in 07/2014 mentions an element of depression. Her husband called and then canceled her February 2016 appt.   The patient reportedly was found outside in her garage naked, altered with facial trauma. She had apparently left multiple suicide notes. On initial ER evaluation, she was noted to be lethargic but oriented and did not recall events that led up to the situation. She denied any ingestions but admitted to leaving suicide notes. CT of the head and neck were assessed and negative for acute injury. She became lethargic in the ER.   PAST MEDICAL HISTORY :   has a past medical history of Diabetes mellitus; Hypertension; Chronic kidney disease; Kidney stones; Allergy; Pancreatic cancer; Primary pancreatic neuroendocrine tumor (2013); Blood transfusion without reported diagnosis; Heart murmur; Thyroid disease (01/2002); PONV (postoperative nausea and vomiting); Hypothyroidism; Cyst (solitary) of breast; Simple cyst of kidney; Incisional hernia; Anemia; Depression; Status post chemotherapy; Kidney failure; Breast cancer; Sarcoma; and Hernia.   has past surgical history that includes buttock sarcoma (2000); Thyroidectomy (07/30/2001); Abdominal hysterectomy; Total knee arthroplasty; Lipoma excision (04/20/2011); Cholecystectomy; spleenectomy; Breast surgery; Urethra dilation (  [id number]);  pressure ulcer buttock (2005); and Breast lumpectomy with needle localization and axillary sentinel lymph node bx (Right, 05/30/2013).     Past Medical History:  Past Medical History  Diagnosis Date  . Diabetes mellitus   . Hypertension   . Chronic kidney disease   . Kidney stones   . Allergy   . Pancreatic cancer     Chemotherapy  . Primary pancreatic neuroendocrine tumor 2013  . Blood transfusion without reported diagnosis   . Heart murmur   . Thyroid disease 01/2002    2 radiaoactive iodine for cancer  . PONV (postoperative nausea and vomiting)   . Hypothyroidism     had thyroid removed 2002  . Cyst (solitary) of breast   . Simple cyst of kidney     unsure of which side maybe on both kidney's  . Incisional hernia   . Anemia   . Depression     takes Lamictal daily  . Status post chemotherapy     pancreatic histrory  . Kidney failure     During chemotherapy for pancreatic cancer  . Breast cancer   . Sarcoma     Right buttock  . Hernia     Abdominal    Past Surgical History  Procedure Laterality Date  . Buttock sarcoma  2000  . Thyroidectomy  61/44/3154    Follicular Palliary carcinoma  . Abdominal hysterectomy    . Total knee arthroplasty      left  . Lipoma excision  04/20/2011    left arm  . Cholecystectomy    . Spleenectomy    . Breast surgery    . Urethral stricture dilatation  [id number]  . Pressure ulcer buttock  2005    from position during knee surgery   . Breast lumpectomy with needle localization and axillary sentinel lymph node bx Right 05/30/2013    Procedure: BREAST LUMPECTOMY WITH NEEDLE LOCALIZATION AND AXILLARY SENTINEL LYMPH NODE BX;  Surgeon: Harl Bowie, MD;  Location: [place];  Service: General;  Laterality: Right;   Family History:  Family History  Problem Relation Age of Onset  . Diabetes Father   . Kidney disease Father   . Cancer Father     colon  . Cancer Maternal Grandmother     breast   Social History:  History  Alcohol  Use No     History  Drug Use No    History   Social History  . Marital Status: Married    Spouse Name: N/A  . Number of Children: 2  . Years of Education: N/A   Social History Main Topics  . Smoking status: Never Smoker   . Smokeless tobacco: Never Used     Comment: never used tobacco  . Alcohol Use: No  . Drug Use: No  . Sexual Activity: Not Currently   Other Topics Concern  . 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                          Allergies:   Allergies  Allergen Reactions  . Metoclopramide Hcl     Tardive dyskinesia-type symptoms  . Ramipril Cough    Labs:  Results for orders placed or performed during the hospital encounter of 12/30/14 (from the past 48 hour(s))  CBC with Differential     Status: Abnormal   Collection Time:  12/30/14 11:22 AM  Result Value Ref Range   WBC 17.6 (H) 4.0 - 10.5 K/uL   RBC 3.56 (L) 3.87 - 5.11 MIL/uL   Hemoglobin 11.2 (L) 12.0 - 15.0 g/dL   HCT 34.5 (L) 36.0 - 46.0 %   MCV 96.9 78.0 - 100.0 fL   MCH 31.5 26.0 - 34.0 pg   MCHC 32.5 30.0 - 36.0 g/dL   RDW 13.6 11.5 - 15.5 %   Platelets 323 150 - 400 K/uL   Neutrophils Relative % 93 (H) 43 - 77 %   Neutro Abs 16.3 (H) 1.7 - 7.7 K/uL   Lymphocytes Relative 4 (L) 12 - 46 %   Lymphs Abs 0.7 0.7 - 4.0 K/uL   Monocytes Relative 3 3 - 12 %   Monocytes Absolute 0.6 0.1 - 1.0 K/uL   Eosinophils Relative 0 0 - 5 %   Eosinophils Absolute 0.0 0.0 - 0.7 K/uL   Basophils Relative 0 0 - 1 %   Basophils Absolute 0.0 0.0 - 0.1 K/uL  Comprehensive metabolic panel     Status: Abnormal   Collection Time: 12/30/14 11:22 AM  Result Value Ref Range   Sodium 142 135 - 145 mmol/L   Potassium 6.0 (H) 3.5 - 5.1 mmol/L   Chloride 107 96 - 112 mmol/L   CO2 16 (L) 19 - 32 mmol/L   Glucose, Bld 133 (H) 70 - 99 mg/dL   BUN 71 (H) 6 - 23 mg/dL   Creatinine, Ser 3.43 (H) 0.50 - 1.10  mg/dL   Calcium 8.9 8.4 - 10.5 mg/dL   Total Protein 7.5 6.0 - 8.3 g/dL   Albumin 3.9 3.5 - 5.2 g/dL   AST 34 0 - 37 U/L   ALT 26 0 - 35 U/L   Alkaline Phosphatase 78 39 - 117 U/L   Total Bilirubin 0.4 0.3 - 1.2 mg/dL   GFR calc non Af Amer 13 (L) >90 mL/min   GFR calc Af Amer 15 (L) >90 mL/min    Comment: (NOTE) The eGFR has been calculated using the CKD EPI equation. This calculation has not been validated in all clinical situations. eGFR's persistently <90 mL/min signify possible Chronic Kidney Disease.    Anion gap 19 (H) 5 - 15  Acetaminophen level     Status: Abnormal   Collection Time: 12/30/14 11:22 AM  Result Value Ref Range   Acetaminophen (Tylenol), Serum <10.0 (L) 10 - 30 ug/mL    Comment:        THERAPEUTIC CONCENTRATIONS VARY SIGNIFICANTLY. A RANGE OF 10-30 ug/mL MAY BE AN EFFECTIVE CONCENTRATION FOR MANY PATIENTS. HOWEVER, SOME ARE BEST TREATED AT CONCENTRATIONS OUTSIDE THIS RANGE. ACETAMINOPHEN CONCENTRATIONS >150 ug/mL AT 4 HOURS AFTER INGESTION AND >50 ug/mL AT 12 HOURS AFTER INGESTION ARE OFTEN ASSOCIATED WITH TOXIC REACTIONS.   Salicylate level     Status: Abnormal   Collection Time: 12/30/14 11:22 AM  Result Value Ref Range   Salicylate Lvl 16.9 (HH) 2.8 - 20.0 mg/dL    Comment: RESULT REPEATED AND VERIFIED CRITICAL RESULT CALLED TO, READ BACK BY AND VERIFIED WITHRosemary Holms RN @ [phone] ON 12/30/14 BY C DAVIS   Ethanol     Status: None   Collection Time: 12/30/14 11:22 AM  Result Value Ref Range   Alcohol, Ethyl (B) <5 0 - 9 mg/dL    Comment:        LOWEST DETECTABLE LIMIT FOR SERUM ALCOHOL IS 11 mg/dL FOR MEDICAL PURPOSES ONLY   CBG monitoring,  ED     Status: Abnormal   Collection Time: 12/30/14 11:25 AM  Result Value Ref Range   Glucose-Capillary 119 (H) 70 - 99 mg/dL  Protime-INR     Status: Abnormal   Collection Time: 12/30/14 11:27 AM  Result Value Ref Range   Prothrombin Time 16.0 (H) 11.6 - 15.2 seconds   INR 1.27 0.00 - 1.49   Carboxyhemoglobin     Status: Abnormal   Collection Time: 12/30/14 12:05 PM  Result Value Ref Range   Total hemoglobin 15.6 12.0 - 16.0 g/dL   O2 Saturation 38.4 %   Carboxyhemoglobin 0.4 (L) 0.5 - 1.5 %   Methemoglobin 1.2 0.0 - 1.5 %  Drug screen panel, emergency     Status: None   Collection Time: 12/30/14  1:00 PM  Result Value Ref Range   Opiates NONE DETECTED NONE DETECTED   Cocaine NONE DETECTED NONE DETECTED   Benzodiazepines NONE DETECTED NONE DETECTED   Amphetamines NONE DETECTED NONE DETECTED   Tetrahydrocannabinol NONE DETECTED NONE DETECTED   Barbiturates NONE DETECTED NONE DETECTED    Comment:        DRUG SCREEN FOR MEDICAL PURPOSES ONLY.  IF CONFIRMATION IS NEEDED FOR ANY PURPOSE, NOTIFY LAB WITHIN 5 DAYS.        LOWEST DETECTABLE LIMITS FOR URINE DRUG SCREEN Drug Class       Cutoff (ng/mL) Amphetamine      1000 Barbiturate      200 Benzodiazepine   009 Tricyclics       233 Opiates          300 Cocaine          300 THC              50   Urinalysis, Routine w reflex microscopic     Status: Abnormal   Collection Time: 12/30/14  1:00 PM  Result Value Ref Range   Color, Urine YELLOW YELLOW   APPearance CLEAR CLEAR   Specific Gravity, Urine 1.013 1.005 - 1.030   pH 6.5 5.0 - 8.0   Glucose, UA NEGATIVE NEGATIVE mg/dL   Hgb urine dipstick SMALL (A) NEGATIVE   Bilirubin Urine NEGATIVE NEGATIVE   Ketones, ur 15 (A) NEGATIVE mg/dL   Protein, ur 100 (A) NEGATIVE mg/dL   Urobilinogen, UA 0.2 0.0 - 1.0 mg/dL   Nitrite NEGATIVE NEGATIVE   Leukocytes, UA NEGATIVE NEGATIVE  Urine microscopic-add on     Status: None   Collection Time: 12/30/14  1:00 PM  Result Value Ref Range   Squamous Epithelial / LPF RARE RARE   WBC, UA 0-2 <3 WBC/hpf   RBC / HPF 0-2 <3 RBC/hpf   Bacteria, UA RARE RARE  CBG monitoring, ED     Status: Abnormal   Collection Time: 12/30/14  1:18 PM  Result Value Ref Range   Glucose-Capillary 163 (H) 70 - 99 mg/dL  Lactic acid, plasma      Status: Abnormal   Collection Time: 12/30/14  1:22 PM  Result Value Ref Range   Lactic Acid, Venous 2.4 (HH) 0.5 - 2.0 mmol/L    Comment: RESULT REPEATED AND VERIFIED CRITICAL RESULT CALLED TO, READ BACK BY AND VERIFIED WITH: Rosemary Holms RN @ [phone] ON 12/30/14 BY C DAVIS   MRSA PCR Screening     Status: None   Collection Time: 12/30/14  3:09 PM  Result Value Ref Range   MRSA by PCR NEGATIVE NEGATIVE    Comment:  The GeneXpert MRSA Assay (FDA approved for NASAL specimens only), is one component of a comprehensive MRSA colonization surveillance program. It is not intended to diagnose MRSA infection nor to guide or monitor treatment for MRSA infections.   I-STAT 3, arterial blood gas (G3+)     Status: Abnormal   Collection Time: 12/30/14  3:33 PM  Result Value Ref Range   pH, Arterial 7.652 (HH) 7.350 - 7.450   pCO2 arterial 17.1 (LL) 35.0 - 45.0 mmHg   pO2, Arterial 79.0 (L) 80.0 - 100.0 mmHg   Bicarbonate 18.9 (L) 20.0 - 24.0 mEq/L   TCO2 19 0 - 100 mmol/L   O2 Saturation 98.0 %   Acid-base deficit 1.0 0.0 - 2.0 mmol/L   Collection site RADIAL, ALLEN'S TEST ACCEPTABLE    Drawn by Operator    Sample type ARTERIAL    Comment NOTIFIED PHYSICIAN   Glucose, capillary     Status: Abnormal   Collection Time: 12/30/14  4:05 PM  Result Value Ref Range   Glucose-Capillary 107 (H) 70 - 99 mg/dL  Basic metabolic panel     Status: Abnormal   Collection Time: 12/30/14  4:34 PM  Result Value Ref Range   Sodium 145 135 - 145 mmol/L   Potassium 4.4 3.5 - 5.1 mmol/L   Chloride 104 96 - 112 mmol/L   CO2 18 (L) 19 - 32 mmol/L   Glucose, Bld 125 (H) 70 - 99 mg/dL   BUN 72 (H) 6 - 23 mg/dL   Creatinine, Ser 3.64 (H) 0.50 - 1.10 mg/dL   Calcium 8.3 (L) 8.4 - 10.5 mg/dL   GFR calc non Af Amer 12 (L) >90 mL/min   GFR calc Af Amer 14 (L) >90 mL/min    Comment: (NOTE) The eGFR has been calculated using the CKD EPI equation. This calculation has not been validated in all clinical  situations. eGFR's persistently <90 mL/min signify possible Chronic Kidney Disease.    Anion gap 23 (H) 5 - 15  Hepatitis B surface antigen     Status: None   Collection Time: 12/30/14  5:00 PM  Result Value Ref Range   Hepatitis B Surface Ag NEGATIVE NEGATIVE    Comment: Performed at [facility]  Hepatitis B core antibody, total     Status: None   Collection Time: 12/30/14  5:00 PM  Result Value Ref Range   Hep B Core Total Ab Negative Negative    Comment: (NOTE) Performed At: [facility] [address] [place], [place] [id number] Lindon Romp MD NO:[id number]   Hepatitis B surface antibody     Status: None   Collection Time: 12/30/14  5:00 PM  Result Value Ref Range   Hep B S Ab Non Reactive     Comment: (NOTE)              Non Reactive: Inconsistent with immunity,                            less than 10 mIU/mL              Reactive:     Consistent with immunity,                            greater than 9.9 mIU/mL Performed At: [facility] [place], [place] [id number] Lindon Romp MD KC:[id number]   I-STAT 3, arterial  blood gas (G3+)     Status: Abnormal   Collection Time: 12/30/14  6:04 PM  Result Value Ref Range   pH, Arterial 7.517 (H) 7.350 - 7.450   pCO2 arterial 30.3 (L) 35.0 - 45.0 mmHg   pO2, Arterial 148.0 (H) 80.0 - 100.0 mmHg   Bicarbonate 24.5 (H) 20.0 - 24.0 mEq/L   TCO2 25 0 - 100 mmol/L   O2 Saturation 100.0 %   Acid-Base Excess 2.0 0.0 - 2.0 mmol/L   Collection site RADIAL, ALLEN'S TEST ACCEPTABLE    Drawn by Operator    Sample type ARTERIAL   Lactic acid, plasma     Status: None   Collection Time: 12/30/14  7:41 PM  Result Value Ref Range   Lactic Acid, Venous 1.7 0.5 - 2.0 mmol/L  Salicylate level     Status: Abnormal   Collection Time: 12/30/14  8:12 PM  Result Value Ref Range   Salicylate Lvl 65.4 (H) 2.8 - 20.0 mg/dL  Albumin     Status: Abnormal   Collection Time: 12/30/14  8:14 PM  Result  Value Ref Range   Albumin 3.0 (L) 3.5 - 5.2 g/dL  Glucose, capillary     Status: None   Collection Time: 12/30/14  8:16 PM  Result Value Ref Range   Glucose-Capillary 94 70 - 99 mg/dL  I-STAT 3, arterial blood gas (G3+)     Status: Abnormal   Collection Time: 12/30/14  8:32 PM  Result Value Ref Range   pH, Arterial 7.562 (H) 7.350 - 7.450   pCO2 arterial 30.4 (L) 35.0 - 45.0 mmHg   pO2, Arterial 121.0 (H) 80.0 - 100.0 mmHg   Bicarbonate 27.4 (H) 20.0 - 24.0 mEq/L   TCO2 28 0 - 100 mmol/L   O2 Saturation 99.0 %   Acid-Base Excess 5.0 (H) 0.0 - 2.0 mmol/L   Patient temperature 98.0 F    Collection site RADIAL, ALLEN'S TEST ACCEPTABLE    Drawn by RT    Sample type ARTERIAL   Salicylate level     Status: Abnormal   Collection Time: 12/30/14 10:06 PM  Result Value Ref Range   Salicylate Lvl 65.0 (H) 2.8 - 20.0 mg/dL  Urinalysis, Routine w reflex microscopic     Status: Abnormal   Collection Time: 12/30/14 11:29 PM  Result Value Ref Range   Color, Urine YELLOW YELLOW   APPearance CLEAR CLEAR   Specific Gravity, Urine 1.011 1.005 - 1.030   pH 7.0 5.0 - 8.0   Glucose, UA NEGATIVE NEGATIVE mg/dL   Hgb urine dipstick SMALL (A) NEGATIVE   Bilirubin Urine NEGATIVE NEGATIVE   Ketones, ur 15 (A) NEGATIVE mg/dL   Protein, ur 100 (A) NEGATIVE mg/dL   Urobilinogen, UA 0.2 0.0 - 1.0 mg/dL   Nitrite NEGATIVE NEGATIVE   Leukocytes, UA NEGATIVE NEGATIVE  Urine microscopic-add on     Status: None   Collection Time: 12/30/14 11:29 PM  Result Value Ref Range   Squamous Epithelial / LPF RARE RARE   WBC, UA 0-2 <3 WBC/hpf   RBC / HPF 0-2 <3 RBC/hpf   Bacteria, UA RARE RARE  Salicylate level     Status: None   Collection Time: 12/31/14 12:00 AM  Result Value Ref Range   Salicylate Lvl 35.4 2.8 - 20.0 mg/dL  Glucose, capillary     Status: Abnormal   Collection Time: 12/31/14 12:22 AM  Result Value Ref Range   Glucose-Capillary 117 (H) 70 - 99 mg/dL  Salicylate level  Status: None    Collection Time: 12/31/14  2:00 AM  Result Value Ref Range   Salicylate Lvl 56.2 2.8 - 20.0 mg/dL  I-STAT 3, arterial blood gas (G3+)     Status: Abnormal   Collection Time: 12/31/14  4:05 AM  Result Value Ref Range   pH, Arterial 7.463 (H) 7.350 - 7.450   pCO2 arterial 46.7 (H) 35.0 - 45.0 mmHg   pO2, Arterial 137.0 (H) 80.0 - 100.0 mmHg   Bicarbonate 33.5 (H) 20.0 - 24.0 mEq/L   TCO2 35 0 - 100 mmol/L   O2 Saturation 99.0 %   Acid-Base Excess 9.0 (H) 0.0 - 2.0 mmol/L   Patient temperature 98.4 F    Collection site RADIAL, ALLEN'S TEST ACCEPTABLE    Drawn by RT    Sample type ARTERIAL   Basic metabolic panel     Status: Abnormal   Collection Time: 12/31/14  4:40 AM  Result Value Ref Range   Sodium 137 135 - 145 mmol/L    Comment: DELTA CHECK NOTED   Potassium 3.2 (L) 3.5 - 5.1 mmol/L    Comment: DELTA CHECK NOTED   Chloride 95 (L) 96 - 112 mmol/L    Comment: DELTA CHECK NOTED   CO2 33 (H) 19 - 32 mmol/L   Glucose, Bld 128 (H) 70 - 99 mg/dL   BUN 15 6 - 23 mg/dL    Comment: DELTA CHECK NOTED   Creatinine, Ser 1.81 (H) 0.50 - 1.10 mg/dL    Comment: DELTA CHECK NOTED   Calcium 6.9 (L) 8.4 - 10.5 mg/dL   GFR calc non Af Amer 27 (L) >90 mL/min   GFR calc Af Amer 32 (L) >90 mL/min    Comment: (NOTE) The eGFR has been calculated using the CKD EPI equation. This calculation has not been validated in all clinical situations. eGFR's persistently <90 mL/min signify possible Chronic Kidney Disease.    Anion gap 9 5 - 15  CBC     Status: Abnormal   Collection Time: 12/31/14  4:40 AM  Result Value Ref Range   WBC 17.2 (H) 4.0 - 10.5 K/uL   RBC 2.61 (L) 3.87 - 5.11 MIL/uL   Hemoglobin 8.3 (L) 12.0 - 15.0 g/dL   HCT 24.7 (L) 36.0 - 46.0 %   MCV 94.6 78.0 - 100.0 fL   MCH 31.8 26.0 - 34.0 pg   MCHC 33.6 30.0 - 36.0 g/dL   RDW 13.7 11.5 - 15.5 %   Platelets 247 150 - 400 K/uL  Glucose, capillary     Status: Abnormal   Collection Time: 12/31/14  4:45 AM  Result Value Ref Range    Glucose-Capillary 118 (H) 70 - 99 mg/dL   Comment 1 Notify RN   Glucose, capillary     Status: Abnormal   Collection Time: 12/31/14  7:28 AM  Result Value Ref Range   Glucose-Capillary 145 (H) 70 - 99 mg/dL  Salicylate level     Status: None   Collection Time: 12/31/14  8:50 AM  Result Value Ref Range   Salicylate Lvl 13.0 2.8 - 20.0 mg/dL  Glucose, capillary     Status: Abnormal   Collection Time: 12/31/14 11:23 AM  Result Value Ref Range   Glucose-Capillary 167 (H) 70 - 99 mg/dL  Glucose, capillary     Status: Abnormal   Collection Time: 12/31/14  3:50 PM  Result Value Ref Range   Glucose-Capillary 112 (H) 70 - 99 mg/dL   Comment 1 Notify RN  Comment 2 Document in Chart   Salicylate level     Status: None   Collection Time: 12/31/14  5:45 PM  Result Value Ref Range   Salicylate Lvl 38.3 2.8 - 20.0 mg/dL  TSH     Status: None   Collection Time: 12/31/14  5:45 PM  Result Value Ref Range   TSH 0.866 0.350 - 4.500 uIU/mL  Glucose, capillary     Status: Abnormal   Collection Time: 12/31/14  8:37 PM  Result Value Ref Range   Glucose-Capillary 122 (H) 70 - 99 mg/dL  Glucose, capillary     Status: Abnormal   Collection Time: 12/31/14 11:44 PM  Result Value Ref Range   Glucose-Capillary 139 (H) 70 - 99 mg/dL  CBC     Status: Abnormal   Collection Time: 01/01/15  3:15 AM  Result Value Ref Range   WBC 16.4 (H) 4.0 - 10.5 K/uL   RBC 2.83 (L) 3.87 - 5.11 MIL/uL   Hemoglobin 8.8 (L) 12.0 - 15.0 g/dL   HCT 27.5 (L) 36.0 - 46.0 %   MCV 97.2 78.0 - 100.0 fL   MCH 31.1 26.0 - 34.0 pg   MCHC 32.0 30.0 - 36.0 g/dL   RDW 13.9 11.5 - 15.5 %   Platelets 256 150 - 400 K/uL  Comprehensive metabolic panel     Status: Abnormal   Collection Time: 01/01/15  3:15 AM  Result Value Ref Range   Sodium 139 135 - 145 mmol/L   Potassium 3.5 3.5 - 5.1 mmol/L   Chloride 94 (L) 96 - 112 mmol/L   CO2 31 19 - 32 mmol/L   Glucose, Bld 149 (H) 70 - 99 mg/dL   BUN 29 (H) 6 - 23 mg/dL    Comment:  DELTA CHECK NOTED   Creatinine, Ser 3.15 (H) 0.50 - 1.10 mg/dL    Comment: DELTA CHECK NOTED   Calcium 6.8 (L) 8.4 - 10.5 mg/dL   Total Protein 5.4 (L) 6.0 - 8.3 g/dL   Albumin 2.9 (L) 3.5 - 5.2 g/dL   AST 51 (H) 0 - 37 U/L   ALT 32 0 - 35 U/L   Alkaline Phosphatase 60 39 - 117 U/L   Total Bilirubin 0.5 0.3 - 1.2 mg/dL   GFR calc non Af Amer 14 (L) >90 mL/min   GFR calc Af Amer 16 (L) >90 mL/min    Comment: (NOTE) The eGFR has been calculated using the CKD EPI equation. This calculation has not been validated in all clinical situations. eGFR's persistently <90 mL/min signify possible Chronic Kidney Disease.    Anion gap 14 5 - 15  Glucose, capillary     Status: Abnormal   Collection Time: 01/01/15  3:54 AM  Result Value Ref Range   Glucose-Capillary 154 (H) 70 - 99 mg/dL  Glucose, capillary     Status: Abnormal   Collection Time: 01/01/15  7:32 AM  Result Value Ref Range   Glucose-Capillary 138 (H) 70 - 99 mg/dL    Vitals: Blood pressure 110/76, pulse 86, temperature 98.3 F (36.8 C), temperature source Oral, resp. rate 14, height _0  (1.549 m), weight 76.4 kg (168 lb 6.9 oz), SpO2 97 %.  Risk to Self: Is patient at risk for suicide?: Yes Risk to Others:   Prior Inpatient Therapy:   Prior Outpatient Therapy:    Current Facility-Administered Medications  Medication Dose Route Frequency Provider Last Rate Last Dose  . 0.9 %  sodium chloride infusion  100 mL Intravenous  PRN Rexene Agent, MD      . 0.9 %  sodium chloride infusion  100 mL Intravenous PRN Rexene Agent, MD      . 0.9 %  sodium chloride infusion  250 mL Intravenous PRN Norman Herrlich, MD      . albuterol (PROVENTIL) (2.5 MG/3ML) 0.083% nebulizer solution 2.5 mg  2.5 mg Nebulization Q3H PRN Donita Brooks, NP      . antiseptic oral rinse (CPC / CETYLPYRIDINIUM CHLORIDE 0.05%) solution 7 mL  7 mL Mouth Rinse BID Wilhelmina Mcardle, MD   7 mL at 12/31/14 2130  . atenolol (TENORMIN) tablet 12.5 mg  12.5 mg Oral  Daily Jake Church Masters, [facility]      . clonazePAM (KLONOPIN) tablet 0.5 mg  0.5 mg Oral Daily PRN Jake Church Masters, [facility]      . feeding supplement (NEPRO CARB STEADY) liquid 237 mL  237 mL Oral PRN Rexene Agent, MD      . fentaNYL (SUBLIMAZE) bolus via infusion 25 mcg  25 mcg Intravenous Q1H PRN Raylene Miyamoto, MD      . heparin injection 5,000 Units  5,000 Units Subcutaneous 3 times per day Otho Bellows, MD   5,000 Units at 01/01/15 0600  . insulin aspart (novoLOG) injection 0-9 Units  0-9 Units Subcutaneous 6 times per day Donita Brooks, NP   1 Units at 01/01/15 0800  . lamoTRIgine (LAMICTAL) tablet 200 mg  200 mg Oral Daily Alexa Sherral Hammers, MD   200 mg at 12/31/14 1500  . levothyroxine (SYNTHROID, LEVOTHROID) tablet 150 mcg  150 mcg Oral QAC breakfast Alexa Sherral Hammers, MD   Stopped at 12/31/14 1500  . midazolam (VERSED) injection 1 mg  1 mg Intravenous Q15 min PRN Corey Harold, NP      . midazolam (VERSED) injection 1 mg  1 mg Intravenous Q2H PRN Corey Harold, NP   1 mg at 12/31/14 0404  . ondansetron (ZOFRAN) injection 4 mg  4 mg Intravenous Q6H PRN Donita Brooks, NP   4 mg at 12/31/14 1630  . potassium chloride SA (K-DUR,KLOR-CON) CR tablet 40 mEq  40 mEq Oral Once Alexa Sherral Hammers, MD        Musculoskeletal: Strength & Muscle Tone: decreased Gait & Station: unable to stand Patient leans: N/A  Psychiatric Specialty Exam: Physical Exam as per history and physical   ROS depression, suicidal thoughts, isolated, withdrawn and status post suicidal attempt, generalized weakness, pain on her right eye and guarded.   Blood pressure 110/76, pulse 86, temperature 98.3 F (36.8 C), temperature source Oral, resp. rate 14, height _0  (1.549 m), weight 76.4 kg (168 lb 6.9 oz), SpO2 97 %.Body mass index is 31.84 kg/(m^2).  General Appearance: Guarded  Eye Contact::  Good  Speech:  Clear and Coherent and Slow  Volume:  Decreased  Mood:  Anxious, Depressed, Hopeless and Worthless   Affect:  Constricted and Depressed  Thought Process:  Coherent and Goal Directed  Orientation:  Full (Time, Place, and Person)  Thought Content:  Rumination  Suicidal Thoughts:  Yes.  with intent/plan  Homicidal Thoughts:  No  Memory:  Immediate;   Fair Recent;   Fair  Judgement:  Impaired  Insight:  Lacking  Psychomotor Activity:  Decreased  Concentration:  Fair  Recall:  [place] of Knowledge:Good  Language: Good  Akathisia:  Negative  Handed:  Right  AIMS (if indicated):  Assets:  Communication Skills Desire for Improvement Financial Resources/Insurance Housing Intimacy Leisure Time Physical Health Resilience Social Support Talents/Skills Transportation  ADL's:  Impaired  Cognition: WNL  Sleep:      Medical Decision Making: Review of Psycho-Social Stressors (1), Established Problem, Worsening (2), Review of Medication Regimen & Side Effects (2) and Review of New Medication or Change in Dosage (2)  Treatment Plan Summary: Daily contact with patient to assess and evaluate symptoms and progress in treatment and Medication management  Plan:  Patient cannot contract for safety at this time and patient needed acute psychiatric hospitalization for crisis evaluation, medication management for major depressive disorder, recurrent, severe without psychotic symptoms and safety monitoring  Suicidal attempt with overdose of salicylates: Patient will be observed 24 hours by safety sitter during this hospitalization  Bipolar depression: Lamotrigine 200 mg daily and continue Synthroid 150 micrograms for hypothyroidism  Patient will be referred to the psychiatric social service regarding acute psychiatric placement  Recommend psychiatric Inpatient admission when medically cleared. Supportive therapy provided about ongoing stressors.   Appreciate psychiatric consultation and follow up as clinically required Please contact [phone] or [phone] if needs further  assistance  Disposition: Patient need cyclic psychiatric hospitalization and he'll be followed up with the psychiatric social service.  Raylee Strehl,JANARDHAHA R. 01/01/2015 9:26 AM

## 2015-01-01 NOTE — Progress Notes (Signed)
eLink Physician-Brief Progress Note Patient Name: KENSEY LUEPKE DOB: 1946/02/01 MRN: [id number]   Date of Service  01/01/2015  HPI/Events of Note  Unable to void. Bladder scan reveals a residual = 658 mL.  eICU Interventions  I&O Cath PRN.     Intervention Category Minor Interventions: Routine modifications to care plan (e.g. PRN medications for pain, fever)  Sommer,Steven Eugene 01/01/2015, 11:39 PM

## 2015-01-01 NOTE — Progress Notes (Signed)
Pt has not voided since the discontinuation of foley. Pt got up once to the bedside commode without success and subsequently refused multiple attempts to get up to the bedside commode and bedpan. Nurse tech bladder scanned patient and it showed 668 ml. On call paged. Awaiting further orders.

## 2015-01-01 NOTE — Clinical Social Work Psych Note (Signed)
Psych CSW received notification of psychiatry consult for possible suicide attempt via OD and depression.  Psychiatry to evaluate.  Psych CSW will follow and assist with disposition.  Nonnie Done, LCSW [phone]  Psychiatric & Orthopedics (5N 1-8) Clinical Social Worker

## 2015-01-01 NOTE — Progress Notes (Signed)
MD Simonds paged at [phone] left call back number to let him know patient was admitted to [facility].

## 2015-01-01 NOTE — Progress Notes (Signed)
PULMONARY / CRITICAL CARE MEDICINE   Name: TONIA AVINO MRN: [id number] DOB: August 26, 1946    ADMISSION DATE:  12/30/2014 CONSULTATION DATE:  12/30/14  REFERRING MD :  Dr. 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 Marin Olp, on tamoxifen only, who presented to the [facility] ER on 4/26 with an aspirin overdose from suicide attempt. Salicylate level now normal and patient extubated.   STUDIES:  4/26  CT Head / Cervical Spine >> R periorbital hematoma & soft tissue swelling without fx, no acute intracranial process, degenerative changes of cervical spine, acquired fusion of C3-4, no acute fx of cervical spine 4/26  UDS >> negative, ETOH >> neg CXR 4/27>> Interim placement of NG tube. Its tip is in the lower chest, presumably in the lower esophagus. Further advanced min is suggested. Endotracheal tube in stable position. Mild cardiomegaly with mild pulmonary vascular prominence. Low lung volumes with basilar atelectasis. Abd Xray 4/27>> Nasogastric tube tip is at the gastroesophageal junction. Advise advancing nasogastric tube 8 to 10 cm to insure placement of the tube tip and side-port in the stomach. Bowel gas pattern unremarkable.  LINES: R Femoral HD cath 4/26>> OETT 4/26>>4/27 OGT 4/26>>4/27  ANTIBIOTICS: NONE  CULTURES: NONE  SIGNIFICANT EVENTS: 4/26>>  Admit with ASA OD 4/26>> Intubated, emergent HD 4/27>> Exubated  SUBJECTIVE: Patient feels well, denies complaints. Refusing CXR and all oral medications.   VITAL SIGNS: Temp:  [98 F (36.7 C)-99.6 F (37.6 C)] 99.1 F (37.3 C) (04/28 0400) Pulse Rate:  [60-100] 87 (04/28 0700) Resp:  [7-20] 14 (04/28 0700) BP:  (94-150)/(47-86) 149/62 mmHg (04/28 0700) SpO2:  [93 %-100 %] 98 % (04/28 0700) Weight:  [168 lb 6.9 oz (76.4 kg)] 168 lb 6.9 oz (76.4 kg) (04/28 0500)      VENTILATOR SETTINGS: Not on vent  INTAKE / OUTPUT:  Intake/Output Summary (Last 24 hours) at 01/01/15 0817 Last data filed at 01/01/15 0600  Gross per 24 hour  Intake 711.25 ml  Output    850 ml  Net -138.75 ml   PHYSICAL EXAMINATION: General:  WDWN, NAD Neuro: AAOx3, however some confusion noted HEENT:  Periorbital edema over right eye with laceration now sutured. Cardiovascular:  RRR Lungs:  CTA bilaterally Abdomen:  Soft, NTND and +BS. Musculoskeletal: No LEE b/l, wearing SCDs  LABS: CBC  Recent Labs Lab 12/30/14 1122 12/31/14 0440 01/01/15 0315  WBC 17.6* 17.2* 16.4*  HGB 11.2* 8.3* 8.8*  HCT 34.5* 24.7* 27.5*  PLT 323 247 256   Coag's  Recent Labs Lab 12/30/14 1127  INR 1.27    BMET  Recent Labs Lab 12/30/14 1634 12/31/14 0440 01/01/15 0315  NA 145 137 139  K 4.4 3.2* 3.5  CL 104 95* 94*  CO2 18* 33* 31  BUN 72* 15 29*  CREATININE 3.64* 1.81* 3.15*  GLUCOSE 125* 128* 149*   Electrolytes  Recent Labs Lab 12/30/14 1634 12/31/14 0440 01/01/15 0315  CALCIUM 8.3* 6.9* 6.8*   Sepsis Markers  Recent Labs Lab 12/30/14 1322 12/30/14 1941  LATICACIDVEN 2.4* 1.7    ABG  Recent Labs Lab 12/30/14 1804 12/30/14 2032 12/31/14 0405  PHART 7.517* 7.562* 7.463*  PCO2ART 30.3* 30.4* 46.7*  PO2ART 148.0* 121.0* 137.0*  Liver Enzymes  Recent Labs Lab 12/30/14 1122 12/30/14 2014 01/01/15 0315  AST 34  --  51*  ALT 26  --  32  ALKPHOS 78  --  60  BILITOT 0.4  --  0.5  ALBUMIN 3.9 3.0* 2.9*    Glucose  Recent Labs Lab 12/31/14 0728 12/31/14 1123 12/31/14 1550 12/31/14 2037 12/31/14 2344 01/01/15 0354  GLUCAP 145* 167* 112* 122* 139* 154*    Imaging Dg Chest Port 1 View  12/31/2014   CLINICAL DATA:  Aspirin overdose.  EXAM: PORTABLE CHEST - 1 VIEW  COMPARISON:   12/30/2014.  FINDINGS: Endotracheal tube in stable position. Interim placement of NG tube. NG tube tip is projected over the lower chest, most likely in the distal esophagus. Further advancement suggested. Calcified right hilar lymph node. Low lung volumes with basilar atelectasis. Mild cardiomegaly pulmonary vascular prominence. Stable elevation left hemidiaphragm. No pneumothorax. No acute bony abnormality.  IMPRESSION: 1. Interim placement of NG tube. Its tip is in the lower chest, presumably in the lower esophagus. Further advanced min is suggested. Endotracheal tube in stable position. 2. Mild cardiomegaly with mild pulmonary vascular prominence. 3. Low lung volumes with basilar atelectasis . Critical Value/emergent results were called by telephone at the time of interpretation on 12/31/2014 at 7:19 am to nurse Olean Ree , who verbally acknowledged these results.   Electronically Signed   By: Marcello Moores  Register   On: 12/31/2014 07:22   Dg Abd Portable 1v  12/31/2014   CLINICAL DATA:  Nasogastric tube placement  EXAM: PORTABLE ABDOMEN - 1 VIEW  COMPARISON:  December 30, 2014  FINDINGS: Nasogastric tube tip is at the gastroesophageal junction. Bowel gas pattern unremarkable. There is atelectasis in the left base region.  IMPRESSION: Nasogastric tube tip is at the gastroesophageal junction. Advise advancing nasogastric tube 8 to 10 cm to insure placement of the tube tip and side-port in the stomach. Bowel gas pattern unremarkable.  These results will be called to the ordering clinician or representative by the Radiologist Assistant, and communication documented in the PACS or zVision Dashboard.   Electronically Signed   By: Lowella Grip III M.D.   On: 12/31/2014 09:48   ASSESSMENT / PLAN:  NEUROLOGIC A:   Aspirin Overdose - initial salicylate level 95. Now normal at 11.7. P:   On fentanyl prn Versed prn for agitation  Psych consulted [facility], suicide precautions Continue home Lamictal 200 mg  QD Restart klonopin as home med, avoid WD  PULMONARY  OETT 4/26>>4/27 A: Respiratory Alkalosis- improved R/o Aspiration R/o Pulmonary Edema related to salicylate toxicity P:   Albuterol prn Supplemental O2 as needed Needs to ambulate  CARDIOVASCULAR CVL R femoral trialysis catheter 8/67>>> A:  Hx Diastolic Dysfunction HTN HLD P: sys up, add atenolol home Even balance goals Dc tele  RENAL A:   Acute on Chronic Kidney Injury - baseline sr cr 2.3. 3.64>21.81>3.15 Hypokalemia AG Metabolic Acidosis- improved P:   No more HD necessary per Nephrology, want to dc catheter, will d/w renal Repeat BMET tomorrow am KDur 40 mEq once (refused) Some hemoconcentration noted, consider pos balance, add saline, chem in am   GASTROINTESTINAL A:   Pancreatic cancer by history Nausea P:   Zofran 4 mg IV Q6H prn  Diet Carb Mod  HEMATOLOGIC A:   Anemia, likely dilutional Leukocytosis 17.2>16.4 (hemoconcetration) P:  Trend CBC  On SCDs until ambulation Heparin SQ TID  INFECTIOUS A:   No active issues Possible Aspiration Pneumonia  P:  Monitor WBC and fever curve CXR LLL is atx likely  IS  ENDOCRINE A:   Diabetes Mellitus  Hypothyroidism: TSH 0.866 P:   CBG Q4H SSI-S Continue home Levothyroxine 150 mcg daily at home (refused)  ONCOLOGY  A: Primary Pancreatic Neuroendocrine Tumor - s/p resection in 2013 Stage IB (T1b, N0, 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  LCB no CPR/cardioversion/trach/peg.  Osa Craver, DO PGY-1 Internal Medicine Resident Pager # [phone] 01/01/2015 8:17 AM   STAFF NOTE: Linwood Dibbles, MD FACP have personally reviewed patient's available data, including medical history, events of note, physical examination and test results as part of my evaluation. I have discussed with  resident/NP and other care providers such as pharmacist, RN and RRT. In addition, I personally evaluated patient and elicited key findings of: No distress, clear lungs, for HD cath removal, chem follow up needed with start of fluid, upright, ambulation, to med floor, psych to see, start home HTn meds  To triad  Lavon Paganini. Titus Mould, MD, [place] Pgr: [facility] 01/01/2015 8:38 AM

## 2015-01-01 NOTE — Progress Notes (Signed)
Bary Richard from [facility] called to check on status of the patient.

## 2015-01-01 NOTE — Progress Notes (Signed)
Admit: 12/30/2014 LOS: 2  29U with salicylate toxicity and AoCKD (BL SCr 2.6) with AMS, hypotension, and VDRF    Subjective:  Extubated to RA yesterday Awake, alert, pleasantly confused this AM Saliclyate level w/o rebound, 11 this AM Ok UOP, SCr up to 3.11    04/27 0701 - 04/28 0700 In: 1221.3 [P.O.:120; I.V.:1001.3; IV Piggyback:100] Out: 850 [Urine:850]  Filed Weights   12/30/14 1700 12/31/14 0500 01/01/15 0500  Weight: 68.04 kg (150 lb) 74.6 kg (164 lb 7.4 oz) 76.4 kg (168 lb 6.9 oz)    Scheduled Meds: . antiseptic oral rinse  7 mL Mouth Rinse BID  . heparin subcutaneous  5,000 Units Subcutaneous 3 times per day  . insulin aspart  0-9 Units Subcutaneous 6 times per day  . lamoTRIgine  200 mg Oral Daily  . levothyroxine  150 mcg Oral QAC breakfast  . pantoprazole (PROTONIX) IV  40 mg Intravenous Q24H  . potassium chloride  40 mEq Oral Once   Continuous Infusions:   PRN Meds:.sodium chloride, sodium chloride, sodium chloride, albuterol, feeding supplement (NEPRO CARB STEADY), fentaNYL, midazolam, midazolam, ondansetron (ZOFRAN) IV  Current Labs: reviewed    Physical Exam:  Blood pressure 149/62, pulse 87, temperature 99.1 F (37.3 C), temperature source Oral, resp. rate 14, height 5\' 1"  (1.549 m), weight 76.4 kg (168 lb 6.9 oz), SpO2 98 %. GEN: awake, alert, confused, NAD ENT: R periorbital hematoma EYES: EOMI CV: RRR, MSM, No rub PULM: normal RR ABD: s/nt/nd SKIN: no rashes/lesions other than R eye EXT:no edema  A/P 1. Salicylate toxicity 1. Presentation value 95.6, down to 19 after single HD 4/26 2. Now safely out of toxic range 3. No indication for further HD at this time 2. AoCKD:  1. SCr up some this AMfollow post HD, making some urine 2. Follow for another day 3. OK to remove femoral HD cath 4. [facility] -- ?Dr. Al Corpus 3. Hyeprkalemia: resolved 4. Metabolic Acidosis, inc AG 2/2 #1: can stop alkalinization  5. AMS 6. VDRF 4/26 after  aspiration event-- resolved 7. Suicide Attempt 8. Breast Cancer 9. Neuroendocrine Pancreatic Cancer  Pearson Grippe MD 01/01/2015, 8:03 AM   Recent Labs Lab 12/30/14 1634 12/31/14 0440 01/01/15 0315  NA 145 137 139  K 4.4 3.2* 3.5  CL 104 95* 94*  CO2 18* 33* 31  GLUCOSE 125* 128* 149*  BUN 72* 15 29*  CREATININE 3.64* 1.81* 3.15*  CALCIUM 8.3* 6.9* 6.8*    Recent Labs Lab 12/30/14 1122 12/31/14 0440 01/01/15 0315  WBC 17.6* 17.2* 16.4*  NEUTROABS 16.3*  --   --   HGB 11.2* 8.3* 8.8*  HCT 34.5* 24.7* 27.5*  MCV 96.9 94.6 97.2  PLT 323 247 256

## 2015-01-02 DIAGNOSIS — E1129 Type 2 diabetes mellitus with other diabetic kidney complication: Secondary | ICD-10-CM

## 2015-01-02 LAB — BASIC METABOLIC PANEL
ANION GAP: 17 — AB (ref 5–15)
BUN: 46 mg/dL — ABNORMAL HIGH (ref 6–23)
CHLORIDE: 104 mmol/L (ref 96–112)
CO2: 24 mmol/L (ref 19–32)
Calcium: 6.8 mg/dL — ABNORMAL LOW (ref 8.4–10.5)
Creatinine, Ser: 2.96 mg/dL — ABNORMAL HIGH (ref 0.50–1.10)
GFR calc non Af Amer: 15 mL/min — ABNORMAL LOW (ref >90)
GFR, EST AFRICAN AMERICAN: 17 mL/min — AB (ref >90)
Glucose, Bld: 124 mg/dL — ABNORMAL HIGH (ref 70–99)
Potassium: 3.3 mmol/L — ABNORMAL LOW (ref 3.5–5.1)
Sodium: 145 mmol/L (ref 135–145)

## 2015-01-02 LAB — CBC
HCT: 26.4 % — ABNORMAL LOW (ref 36.0–46.0)
Hemoglobin: 8.5 g/dL — ABNORMAL LOW (ref 12.0–15.0)
MCH: 31.4 pg (ref 26.0–34.0)
MCHC: 32.2 g/dL (ref 30.0–36.0)
MCV: 97.4 fL (ref 78.0–100.0)
Platelets: 249 10*3/uL (ref 150–400)
RBC: 2.71 MIL/uL — ABNORMAL LOW (ref 3.87–5.11)
RDW: 13.9 % (ref 11.5–15.5)
WBC: 14.3 10*3/uL — ABNORMAL HIGH (ref 4.0–10.5)

## 2015-01-02 LAB — GLUCOSE, CAPILLARY
GLUCOSE-CAPILLARY: 272 mg/dL — AB (ref 70–99)
Glucose-Capillary: 126 mg/dL — ABNORMAL HIGH (ref 70–99)
Glucose-Capillary: 139 mg/dL — ABNORMAL HIGH (ref 70–99)
Glucose-Capillary: 214 mg/dL — ABNORMAL HIGH (ref 70–99)
Glucose-Capillary: 344 mg/dL — ABNORMAL HIGH (ref 70–99)

## 2015-01-02 MED ORDER — AMOXICILLIN-POT CLAVULANATE 875-125 MG PO TABS
1.0000 | ORAL_TABLET | Freq: Every day | ORAL | Status: DC
  Administered 2015-01-02 – 2015-01-05 (×4): 1 via ORAL
  Filled 2015-01-02 (×4): qty 1

## 2015-01-02 MED ORDER — INSULIN ASPART 100 UNIT/ML ~~LOC~~ SOLN
0.0000 [IU] | Freq: Three times a day (TID) | SUBCUTANEOUS | Status: DC
  Administered 2015-01-02: 8 [IU] via SUBCUTANEOUS
  Administered 2015-01-02 – 2015-01-03 (×3): 5 [IU] via SUBCUTANEOUS
  Administered 2015-01-03: 8 [IU] via SUBCUTANEOUS
  Administered 2015-01-04: 5 [IU] via SUBCUTANEOUS
  Administered 2015-01-04 (×2): 3 [IU] via SUBCUTANEOUS
  Administered 2015-01-05: 2 [IU] via SUBCUTANEOUS

## 2015-01-02 MED ORDER — HALOPERIDOL 2 MG PO TABS
2.0000 mg | ORAL_TABLET | Freq: Four times a day (QID) | ORAL | Status: DC | PRN
  Filled 2015-01-02: qty 1

## 2015-01-02 MED ORDER — INSULIN ASPART 100 UNIT/ML ~~LOC~~ SOLN
0.0000 [IU] | Freq: Every day | SUBCUTANEOUS | Status: DC
  Administered 2015-01-02: 4 [IU] via SUBCUTANEOUS
  Administered 2015-01-03: 2 [IU] via SUBCUTANEOUS

## 2015-01-02 NOTE — Evaluation (Signed)
Clinical/Bedside Swallow Evaluation Patient Details  Name: Kristi Fuentes MRN: [id number] Date of Birth: 03/04/1946  Today's Date: 01/02/2015 Time: SLP Start Time (ACUTE ONLY): 0115 SLP Stop Time (ACUTE ONLY): 0145 SLP Time Calculation (min) (ACUTE ONLY): 30 min  Past Medical History:  Past Medical History  Diagnosis Date  . Diabetes mellitus   . Hypertension   . Chronic kidney disease   . Kidney stones   . Allergy   . Pancreatic cancer     Chemotherapy  . Primary pancreatic neuroendocrine tumor 2013  . Blood transfusion without reported diagnosis   . Heart murmur   . Thyroid disease 01/2002    2 radiaoactive iodine for cancer  . PONV (postoperative nausea and vomiting)   . Hypothyroidism     had thyroid removed 2002  . Cyst (solitary) of breast   . Simple cyst of kidney     unsure of which side maybe on both kidney's  . Incisional hernia   . Anemia   . Depression     takes Lamictal daily  . Status post chemotherapy     pancreatic histrory  . Kidney failure     During chemotherapy for pancreatic cancer  . Breast cancer   . Sarcoma     Right buttock  . Hernia     Abdominal   Past Surgical History:  Past Surgical History  Procedure Laterality Date  . Buttock sarcoma  2000  . 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 Abdominal hysterectomy    . Total knee arthroplasty      left  . Lipoma excision  04/20/2011    left arm  . Cholecystectomy    . Spleenectomy    . Breast surgery    . Urethral stricture dilatation  [id number]  . Pressure ulcer buttock  2005    from position during knee surgery   . 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 Marin Olp, on tamoxifen only, Husband refused for her to get Herceptin, chemo or radiation per Dr. Dicie Beam notes - "they would not help her") who presented to the [facility] on 4/26 with an aspirin overdose. Dr. Dicie Beam last note in 07/2014 mentions an element of depression. Her husband called and then canceled her February 2016 appt. Pt denies any dysphagia and nursing had no concerns with medication administration.  Most recent CXR on 01/01/15 indicated Small infiltrate/atelectasis at the left lung base, slightly increased   Assessment / Plan / Recommendation Clinical Impression   Pt exhibited no s/s of aspiration during BSE, except for one incident of a delayed cough with puree, but resolved with subsequent swallows of liquid; pt c/o "dry mouth" and "I can't get enough to drink" during BSE; minimal appetite noted by pt and nursing stated she took oral medications without difficulty; ST to f/u 1-2x prn due to recent intubation (<24 hrs) and possible cognitive evaluation d/t disorientation/confusion during BSE if symptoms do not resolve independently    Aspiration Risk  Mild  Diet Recommendation Regular;Thin liquid   Medication Administration: Whole meds with liquid Supervision: Patient able to self feed Compensations: Slow rate;Small sips/bites Postural Changes and/or Swallow Maneuvers: Seated upright 90 degrees    Other  Recommendations Oral Care Recommendations: Oral care BID   Follow Up Recommendations    ST to f/u 1-2x for diet tolerance d/t recent intubation (<24 hrs)   Frequency and Duration min 1 x/week  1 week   Pertinent Vitals/Pain Elevated BP    SLP Swallow Goals  See POC   Swallow Study Prior Functional Status   Independent at home    General Date of Onset:  12/30/14 HPI: Pt is a 69 yo female with PMH of HTN, DM, CKD ([facility]ed at [facility]), hypothyroidism, anemia, depression, 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 Marin Olp, on tamoxifen only, Husband refused for her to get Herceptin, chemo or radiation per Dr. Dicie Beam notes - "they would not help her") who presented to the [facility] on 4/26 with an aspirin overdose. Dr. Dicie Beam last note in 07/2014 mentions an element of depression. Her husband called and then canceled her February 2016 appt.   Type of Study: Bedside swallow evaluation Previous Swallow Assessment: n/a Diet Prior to this Study: Regular;Other (Comment) (carb modified) Temperature Spikes Noted: No Respiratory Status: Room air History of Recent Intubation: Yes Length of Intubations (days):  (<24 hrs) Date extubated: 12/31/14 Behavior/Cognition: Alert;Cooperative;Confused Oral Cavity - Dentition: Adequate natural dentition Self-Feeding Abilities: Able to feed self Patient Positioning: Upright in bed Baseline Vocal Quality: Clear Volitional Cough: Strong Volitional Swallow: Able to elicit    Oral/Motor/Sensory Function Overall Oral Motor/Sensory Function: Appears within functional limits for tasks assessed   Ice Chips Ice chips: Within functional limits Presentation: Cup   Thin Liquid Thin Liquid: Within functional limits Presentation: Cup;Straw    Nectar Thick Nectar Thick Liquid: Not tested   Honey Thick Honey Thick Liquid: Not tested   Puree Puree: Within functional limits Presentation: Self Fed   Solid       Solid: Within functional limits Presentation: Self Fed       ADAMS,PAT, M.S., CCC-SLP 01/02/2015,2:12 PM

## 2015-01-02 NOTE — Progress Notes (Signed)
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 Marin Olp, on tamoxifen only, Husband refused for her to get Herceptin, chemo or radiation per Dr. 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 Marin Olp, on tamoxifen only, Husband refused for her to get Herceptin, chemo or radiation per Dr. Dicie Beam notes - "they would not help her") who presented to the [place] on 4/26 with an aspirin overdose    PT Comments    Pt admitted with/for aspirin OD.  Pt currently limited functionally due to the problems listed below.  (see problems list.)  Pt will benefit from PT to maximize function and safety to be able to get home safely with available assist of family.   Follow Up Recommendations  Home health PT;Supervision for mobility/OOB     Equipment Recommendations  None recommended by PT    Recommendations for Other Services       Precautions / Restrictions Precautions Precautions: Fall    Mobility  Bed Mobility Overal bed mobility: Needs Assistance Bed Mobility: Supine to Sit     Supine to sit: Supervision (with rail)        Transfers Overall transfer level: Needs assistance   Transfers: Sit to/from Stand Sit to Stand: Supervision         General transfer comment: good safety; stability against the  bed.  Ambulation/Gait Ambulation/Gait assistance: Min assist;Min guard Ambulation Distance (Feet): 140 Feet Assistive device: 1 person hand held assist Gait Pattern/deviations: Step-through pattern Gait velocity: slower Gait velocity interpretation: Below normal speed for age/gender     Stairs            Wheelchair Mobility    Modified Rankin (Stroke Patients Only)       Balance Overall balance assessment: Needs assistance Sitting-balance support: No upper extremity supported;Feet supported Sitting balance-Leahy Scale: Good     Standing balance support: No upper extremity supported Standing balance-Leahy Scale: Fair                      Cognition Arousal/Alertness: Awake/alert Behavior During Therapy: WFL for tasks assessed/performed Overall Cognitive Status: Within Functional Limits for tasks assessed                      Exercises      General Comments        Pertinent Vitals/Pain Pain Assessment: No/denies pain    Home Living Family/patient expects to be discharged to:: Private residence Living Arrangements: Spouse/significant other Available Help at Discharge: Family Type of Home: House Home Access: Stairs to enter Entrance Stairs-Rails: Right;Left Home Layout: Two level;Bed/bath upstairs Home Equipment: Environmental consultant - 2 wheels;Cane - single point;Bedside commode      Prior Function Level of Independence: Independent          PT Goals (current goals can now be found in the care plan section)  Acute Rehab PT Goals Patient Stated Goal: independent in the home. PT Goal Formulation: With patient Time For Goal Achievement: 01/09/15 Potential to Achieve Goals: Good    Frequency  Min 3X/week    PT Plan      Co-evaluation             End of Session   Activity Tolerance: Patient tolerated treatment well Patient left: in chair;with call bell/phone within reach;with nursing/sitter in room     Time: 1521-1545 PT Time  Calculation (min) (ACUTE ONLY): 24 min  Charges:  $Gait Training: 8-22 mins                    G Codes:      Jacques Fife, Tessie Fass 01/02/2015, 4:01 PM  01/02/2015  Donnella Sham, [place] [phone]  (pager)

## 2015-01-02 NOTE — Progress Notes (Signed)
Pt in/out cath with 900 ml of urine. Will continue to monitor pt.

## 2015-01-02 NOTE — Progress Notes (Addendum)
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 Marin Olp, on tamoxifen only, who presented to the [facility] ER on 4/26 with an aspirin overdose from suicide attempt. Salicylate level  normal and patient extubated. Tx from PCCM to [facility] on 4/29  Assessment/Plan:  ASA OD -seen by psych, needs placement Sitter, suicide precautions lamictal  AKI on CKD IV- Cr baseline was 2 -s/p HD -monitor -renal following  Leukocytosis ? PNA, low grade fever- will add abx Anemia  DM -SSI  Hypothyroid -synthroid -TSH ok  Aspiration PNA -add augmentin  PT Eval now that HD catheter removed Not sure why patient is NPO  Code Status: full Family Communication:  Disposition Plan: inpt psych?   Consultants:  PCCM  Psych  renal  Procedures:      HPI/Subjective: No SOB, no CP Wants to get up Says her legs are cramping  Objective: Filed Vitals:   01/02/15 0523  BP: 161/59  Pulse: 71  Temp: 98 F (36.7 C)  Resp: 18    Intake/Output Summary (Last 24 hours) at 01/02/15 0810 Last data filed at 01/02/15 0009  Gross per 24 hour  Intake  917.5 ml  Output    900 ml  Net   17.5 ml   Filed Weights   12/31/14 0500 01/01/15 0500 01/02/15 0523  Weight: 74.6 kg (164 lb 7.4 oz) 76.4 kg (168 lb 6.9 oz) 77 kg (169 lb 12.1 oz)    Exam:   General:  Knows who she is and where she is but not always appropriate- tried to hold my stethosope  Cardiovascular: rrr  Respiratory: diminished but no wheezing  Abdomen: +BS, soft  Musculoskeletal: no edmea   Data Reviewed: Basic Metabolic Panel:  Recent Labs Lab 12/30/14 1122 12/30/14 1634 12/31/14 0440 01/01/15 0315  NA  142 145 137 139  K 6.0* 4.4 3.2* 3.5  CL 107 104 95* 94*  CO2 16* 18* 33* 31  GLUCOSE 133* 125* 128* 149*  BUN 71* 72* 15 29*  CREATININE 3.43* 3.64* 1.81* 3.15*  CALCIUM 8.9 8.3* 6.9* 6.8*   Liver Function Tests:  Recent Labs Lab 12/30/14 1122 12/30/14 2014 01/01/15 0315  AST 34  --  51*  ALT 26  --  32  ALKPHOS 78  --  60  BILITOT 0.4  --  0.5  PROT 7.5  --  5.4*  ALBUMIN 3.9 3.0* 2.9*   No results for input(s): LIPASE, AMYLASE in the last 168 hours. No results for input(s): AMMONIA in the last 168 hours. CBC:  Recent Labs Lab 12/30/14 1122 12/31/14 0440 01/01/15 0315  WBC 17.6* 17.2* 16.4*  NEUTROABS 16.3*  --   --   HGB 11.2* 8.3* 8.8*  HCT 34.5* 24.7* 27.5*  MCV 96.9 94.6 97.2  PLT 323 247 256   Cardiac Enzymes: No results for input(s): CKTOTAL, CKMB, CKMBINDEX, TROPONINI in the last 168 hours. BNP (last 3 results) No results for input(s): BNP in the last 8760 hours.  ProBNP (last 3 results) No results for input(s): PROBNP in the last 8760 hours.  CBG:  Recent Labs Lab 01/01/15 1110 01/01/15 1720 01/01/15 2014 01/02/15 0018 01/02/15 0511  GLUCAP 136* 115* 135* 139* 126*    Recent Results (  from the past 240 hour(s))  MRSA PCR Screening     Status: None   Collection Time: 12/30/14  3:09 PM  Result Value Ref Range Status   MRSA by PCR NEGATIVE NEGATIVE Final    Comment:        The GeneXpert MRSA Assay (FDA approved for NASAL specimens only), is one component of a comprehensive MRSA colonization surveillance program. It is not intended to diagnose MRSA infection nor to guide or monitor treatment for MRSA infections.      Studies: Dg Chest Port 1 View  01/01/2015   CLINICAL DATA:  Aspiration pneumonia.  EXAM: PORTABLE CHEST - 1 VIEW  COMPARISON:  12/31/2014, 12/30/2014, and 05/23/2013  FINDINGS: Endotracheal tube and NG tube have been removed.  Small focal area of infiltrate/ atelectasis at the left base. Minimal atelectasis at the right  base. Chronic elevation of the left hemidiaphragm, unchanged since 05/23/2013.  Heart size and pulmonary vascularity are normal. Calcified lymph node adjacent to the carina.  IMPRESSION: Small infiltrate/atelectasis at the left lung base, slightly increased.   Electronically Signed   By: Lorriane Shire M.D.   On: 01/01/2015 08:28   Dg Abd Portable 1v  12/31/2014   CLINICAL DATA:  Nasogastric tube placement  EXAM: PORTABLE ABDOMEN - 1 VIEW  COMPARISON:  December 30, 2014  FINDINGS: Nasogastric tube tip is at the gastroesophageal junction. Bowel gas pattern unremarkable. There is atelectasis in the left base region.  IMPRESSION: Nasogastric tube tip is at the gastroesophageal junction. Advise advancing nasogastric tube 8 to 10 cm to insure placement of the tube tip and side-port in the stomach. Bowel gas pattern unremarkable.  These results will be called to the ordering clinician or representative by the Radiologist Assistant, and communication documented in the PACS or zVision Dashboard.   Electronically Signed   By: Lowella Grip III M.D.   On: 12/31/2014 09:48    Scheduled Meds: . antiseptic oral rinse  7 mL Mouth Rinse BID  . atenolol  12.5 mg Oral Daily  . heparin subcutaneous  5,000 Units Subcutaneous 3 times per day  . insulin aspart  0-9 Units Subcutaneous 6 times per day  . lamoTRIgine  200 mg Oral Daily  . levothyroxine  150 mcg Oral QAC breakfast  . potassium chloride  40 mEq Oral Once   Continuous Infusions: . sodium chloride 75 mL/hr at 01/01/15 2353  . sodium chloride     Antibiotics Given (last 72 hours)    None      Principal Problem:   Salicylate overdose Active Problems:   Aspirin overdose   Aspiration into airway   Encounter for orogastric (OG) tube placement   Encounter for nasogastric (NG) tube placement    Time spent: 25 min    VANN, JESSICA  [facility] Pager [phone]. If 7PM-7AM, please contact night-coverage at [email or web address], password  [facility] 01/02/2015, 8:10 AM  LOS: 3 days

## 2015-01-02 NOTE — Progress Notes (Signed)
Admit: 12/30/2014 LOS: 3  25O with salicylate toxicity and AoCKD (BL SCr 2.6) with AMS, hypotension, and VDRF    Subjective:  Now out of ICU GFR stable over past 24h Seen by psyhiatry, needing inpatient care    04/28 0701 - 04/29 0700 In: 1097.5 [I.V.:717.5] Out: 900 [Urine:900]  Filed Weights   12/31/14 0500 01/01/15 0500 01/02/15 0523  Weight: 74.6 kg (164 lb 7.4 oz) 76.4 kg (168 lb 6.9 oz) 77 kg (169 lb 12.1 oz)    Scheduled Meds: . amoxicillin-clavulanate  1 tablet Oral Daily  . antiseptic oral rinse  7 mL Mouth Rinse BID  . atenolol  12.5 mg Oral Daily  . heparin subcutaneous  5,000 Units Subcutaneous 3 times per day  . insulin aspart  0-15 Units Subcutaneous TID WC  . insulin aspart  0-5 Units Subcutaneous QHS  . lamoTRIgine  200 mg Oral Daily  . levothyroxine  150 mcg Oral QAC breakfast  . potassium chloride  40 mEq Oral Once   Continuous Infusions: . sodium chloride 75 mL/hr at 01/01/15 2353  . sodium chloride     PRN Meds:.sodium chloride, sodium chloride, albuterol, clonazePAM, feeding supplement (NEPRO CARB STEADY), fentaNYL, midazolam, midazolam, ondansetron (ZOFRAN) IV  Current Labs: reviewed    Physical Exam:  Blood pressure 161/59, pulse 71, temperature 98 F (36.7 C), temperature source Oral, resp. rate 18, height 5\' 1"  (1.549 m), weight 77 kg (169 lb 12.1 oz), SpO2 95 %. GEN: awake, alert, confused, NAD ENT: R periorbital hematoma EYES: EOMI CV: RRR, MSM, No rub PULM: normal RR ABD: s/nt/nd SKIN: no rashes/lesions other than R eye EXT:no edema  A/P 1. Salicylate toxicity 1. Presentation value 95.6, down to 19 after single HD 4/26 2. Now safely out of toxic range 3. No indication for further HD at this time 2. AoCKD:  1. SCr up some this AMfollow post HD, making some urine 2. Follow for another day 3. OK to remove femoral HD cath 4. [facility] -- Dr. Al Corpus 5. GFR stable in past 24h, will signoff and pt to r/u with her previous  nephrologist after acute issues resolved; please call with any questions or issues 3. VDRF 4/26 after aspiration event-- resolved 4. Suicide Attempt 5. Breast Cancer 6. Neuroendocrine Pancreatic Cancer  Pearson Grippe MD 01/02/2015, 12:27 PM   Recent Labs Lab 12/31/14 0440 01/01/15 0315 01/02/15 0708  NA 137 139 145  K 3.2* 3.5 3.3*  CL 95* 94* 104  CO2 33* 31 24  GLUCOSE 128* 149* 124*  BUN 15 29* 46*  CREATININE 1.81* 3.15* 2.96*  CALCIUM 6.9* 6.8* 6.8*    Recent Labs Lab 12/30/14 1122 12/31/14 0440 01/01/15 0315 01/02/15 0708  WBC 17.6* 17.2* 16.4* 14.3*  NEUTROABS 16.3*  --   --   --   HGB 11.2* 8.3* 8.8* 8.5*  HCT 34.5* 24.7* 27.5* 26.4*  MCV 96.9 94.6 97.2 97.4  PLT 323 247 256 249

## 2015-01-02 NOTE — Care Management Note (Signed)
    Page 1 of 1   01/05/2015     11:41:25 AM CARE MANAGEMENT NOTE 01/05/2015  Patient:  Kristi Fuentes, Kristi Fuentes   Account Number:  [id number]  Date Initiated:  12/31/2014  Documentation initiated by:  [facility]  Subjective/Objective Assessment:   Admitted with OD, confusion, HD, Asp PNU     Action/Plan:   will follow for care, may be transfer to inpt psych   Anticipated DC Date:  01/07/2015   Anticipated DC Plan:  [place] referral  Clinical Social Worker      DC Planning Services  CM consult      Choice offered to / List presented to:  C-1 Patient        [place] arranged  HH-2 PT      [place]   Status of service:  Completed, signed off Medicare Important Message given?  YES (If response is "NO", the following Medicare IM given date fields will be blank) Date Medicare IM given:  01/02/2015 Medicare IM given by:  Carles Collet Date Additional Medicare IM given:  01/05/2015 Additional Medicare IM given by:  Carles Collet  Discharge Disposition:  [place]  Per UR Regulation:  Reviewed for med. necessity/level of care/duration of stay  If discussed at [place] of Stay Meetings, dates discussed:    Comments:  01-05-15. IM letter given [place] Go notified for [facility]. Carles Collet RN BSN CM  01-02-15 IM letter given. Carles Collet RN BSN CM  Contact:  Donnie Mesa Spouse [phone]

## 2015-01-02 NOTE — Progress Notes (Signed)
Nutrition Brief Note  Patient identified on the Malnutrition Screening Tool (MST) Report  Wt Readings from Last 15 Encounters:  01/02/15 169 lb 12.1 oz (77 kg)  07/09/14 148 lb (67.132 kg)  04/09/14 160 lb (72.576 kg)  02/21/14 159 lb (72.122 kg)  01/08/14 160 lb (72.576 kg)  10/09/13 164 lb (74.39 kg)  09/16/13 165 lb 6.4 oz (75.025 kg)  08/28/13 166 lb 1 oz (75.324 kg)  07/16/13 160 lb (72.576 kg)  06/28/13 160 lb 14.4 oz (72.984 kg)  06/19/13 161 lb 12.8 oz (73.392 kg)  06/17/13 159 lb (72.122 kg)  05/09/13 159 lb 12.8 oz (72.485 kg)  04/11/11 165 lb 12.8 oz (75.206 kg)   69 y/o F with PMH of HTN, DM, CKD (followed at [facility]), hypothyroidism, anemia, depression, 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 Marin Olp, on tamoxifen only, who presented to the [place] ER on 4/26 with an aspirin overdose from suicide attempt. Salicylate level now normal and patient extubated.   Pt wt as been stable over the past 2 years. She was just advanced to a carb modified diet this morning. She is awaiting inpatient psychiatric placement.   Body mass index is 32.09 kg/(m^2). Patient meets criteria for obesity, class I based on current BMI.   Current diet order is Carb Modified, patient is consuming approximately n/a% of meals at this time. Labs and medications reviewed.   No nutrition interventions warranted at this time. If nutrition issues arise, please consult RD.   Haydin Calandra A. Jimmye Norman, RD, LDN, CDE Pager: [phone] After hours Pager: [phone]

## 2015-01-03 DIAGNOSIS — I10 Essential (primary) hypertension: Secondary | ICD-10-CM

## 2015-01-03 DIAGNOSIS — F3130 Bipolar disorder, current episode depressed, mild or moderate severity, unspecified: Secondary | ICD-10-CM

## 2015-01-03 DIAGNOSIS — E119 Type 2 diabetes mellitus without complications: Secondary | ICD-10-CM

## 2015-01-03 DIAGNOSIS — E118 Type 2 diabetes mellitus with unspecified complications: Secondary | ICD-10-CM

## 2015-01-03 LAB — GLUCOSE, CAPILLARY
Glucose-Capillary: 258 mg/dL — ABNORMAL HIGH (ref 70–99)
Glucose-Capillary: 236 mg/dL — ABNORMAL HIGH (ref 70–99)
Glucose-Capillary: 228 mg/dL — ABNORMAL HIGH (ref 70–99)
Glucose-Capillary: 195 mg/dL — ABNORMAL HIGH (ref 70–99)

## 2015-01-03 MED ORDER — INSULIN ASPART 100 UNIT/ML ~~LOC~~ SOLN
3.0000 [IU] | Freq: Two times a day (BID) | SUBCUTANEOUS | Status: DC
  Administered 2015-01-03 – 2015-01-05 (×4): 3 [IU] via SUBCUTANEOUS

## 2015-01-03 MED ORDER — AMLODIPINE BESYLATE 10 MG PO TABS
10.0000 mg | ORAL_TABLET | Freq: Every day | ORAL | Status: DC
  Administered 2015-01-03 – 2015-01-05 (×3): 10 mg via ORAL
  Filled 2015-01-03 (×3): qty 1

## 2015-01-03 MED ORDER — INSULIN GLARGINE 100 UNIT/ML ~~LOC~~ SOLN
15.0000 [IU] | Freq: Every day | SUBCUTANEOUS | Status: DC
  Administered 2015-01-03 – 2015-01-04 (×2): 15 [IU] via SUBCUTANEOUS
  Filled 2015-01-03 (×4): qty 0.15

## 2015-01-03 MED ORDER — INSULIN GLARGINE 100 UNIT/ML ~~LOC~~ SOLN: 22.0000 [IU] | Freq: Every day | SUBCUTANEOUS | Status: DC

## 2015-01-03 NOTE — Progress Notes (Signed)
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 Marin Olp, on tamoxifen only, who presented to the [facility] ER on 4/26 with an aspirin overdose from suicide attempt. Salicylate level  normal and patient extubated. Tx from PCCM to [facility] on 4/29  Assessment/Plan:  ASA OD -seen by psych, needs placement Sitter, suicide precautions lamictal  AKI on CKD IV- Cr baseline was 2 -s/p HD -monitor -renal following  Leukocytosis ? PNA, low grade fever- will add abx Anemia  DM -SSI  Hypothyroid -synthroid -TSH ok  Aspiration PNA -add augmentin  Medically stable to move to inpatient psych unit   Code Status: full Family Communication: son on phone 4/29 Disposition Plan: inpt psych?   Consultants:  PCCM  Psych  renal  Procedures:      HPI/Subjective: Much more oriented today Asking about blood sugar this AM  Objective: Filed Vitals:   01/03/15 0900  BP: 152/60  Pulse: 60  Temp:   Resp:     Intake/Output Summary (Last 24 hours) at 01/03/15 0946 Last data filed at 01/03/15 0655  Gross per 24 hour  Intake 2033.75 ml  Output   1550 ml  Net 483.75 ml   Filed Weights   01/01/15 0500 01/02/15 0523 01/03/15 0605  Weight: 76.4 kg (168 lb 6.9 oz) 77 kg (169 lb 12.1 oz) 74.3 kg (163 lb 12.8 oz)    Exam:   General:   Cardiovascular: rrr  Respiratory: diminished but no wheezing  Abdomen: +BS, soft  Musculoskeletal: no edmea   Data Reviewed: Basic Metabolic Panel:  Recent Labs Lab 12/30/14 1122 12/30/14 1634 12/31/14 0440 01/01/15 0315 01/02/15 0708  NA 142 145 137 139 145  K 6.0* 4.4 3.2* 3.5 3.3*  CL 107 104 95* 94* 104  CO2 16* 18*  33* 31 24  GLUCOSE 133* 125* 128* 149* 124*  BUN 71* 72* 15 29* 46*  CREATININE 3.43* 3.64* 1.81* 3.15* 2.96*  CALCIUM 8.9 8.3* 6.9* 6.8* 6.8*   Liver Function Tests:  Recent Labs Lab 12/30/14 1122 12/30/14 2014 01/01/15 0315  AST 34  --  51*  ALT 26  --  32  ALKPHOS 78  --  60  BILITOT 0.4  --  0.5  PROT 7.5  --  5.4*  ALBUMIN 3.9 3.0* 2.9*   No results for input(s): LIPASE, AMYLASE in the last 168 hours. No results for input(s): AMMONIA in the last 168 hours. CBC:  Recent Labs Lab 12/30/14 1122 12/31/14 0440 01/01/15 0315 01/02/15 0708  WBC 17.6* 17.2* 16.4* 14.3*  NEUTROABS 16.3*  --   --   --   HGB 11.2* 8.3* 8.8* 8.5*  HCT 34.5* 24.7* 27.5* 26.4*  MCV 96.9 94.6 97.2 97.4  PLT 323 247 256 249   Cardiac Enzymes: No results for input(s): CKTOTAL, CKMB, CKMBINDEX, TROPONINI in the last 168 hours. BNP (last 3 results) No results for input(s): BNP in the last 8760 hours.  ProBNP (last 3 results) No results for input(s): PROBNP in the last 8760 hours.  CBG:  Recent Labs Lab 01/02/15 0511 01/02/15 1132 01/02/15 1727 01/02/15 2212 01/03/15 0747  GLUCAP 126* 214* 272* 344* 258*    Recent Results (from the past 240 hour(s))  MRSA PCR  Screening     Status: None   Collection Time: 12/30/14  3:09 PM  Result Value Ref Range Status   MRSA by PCR NEGATIVE NEGATIVE Final    Comment:        The GeneXpert MRSA Assay (FDA approved for NASAL specimens only), is one component of a comprehensive MRSA colonization surveillance program. It is not intended to diagnose MRSA infection nor to guide or monitor treatment for MRSA infections.      Studies: No results found.  Scheduled Meds: . amoxicillin-clavulanate  1 tablet Oral Daily  . antiseptic oral rinse  7 mL Mouth Rinse BID  . atenolol  12.5 mg Oral Daily  . heparin subcutaneous  5,000 Units Subcutaneous 3 times per day  . insulin aspart  0-15 Units Subcutaneous TID WC  . insulin aspart  0-5 Units  Subcutaneous QHS  . lamoTRIgine  200 mg Oral Daily  . levothyroxine  150 mcg Oral QAC breakfast  . potassium chloride  40 mEq Oral Once   Continuous Infusions:   Antibiotics Given (last 72 hours)    Date/Time Action Medication Dose   01/02/15 1242 Given   amoxicillin-clavulanate (AUGMENTIN) 875-125 MG per tablet 1 tablet 1 tablet   01/03/15 0900 Given   amoxicillin-clavulanate (AUGMENTIN) 875-125 MG per tablet 1 tablet 1 tablet      Principal Problem:   Salicylate overdose Active Problems:   Aspirin overdose   Aspiration into airway   Encounter for orogastric (OG) tube placement   Encounter for nasogastric (NG) tube placement    Time spent: 25 min    VANN, JESSICA  [facility] Pager [phone]. If 7PM-7AM, please contact night-coverage at [email or web address], password [facility] 01/03/2015, 9:46 AM  LOS: 4 days

## 2015-01-03 NOTE — Consult Note (Signed)
[facility] Face-to-Face Psychiatry Consult   Reason for Consult:  Status post Salicylate overdose and Depression  Referring Physician:  Dr. Alva Garnet / Dr. Marvel Plan Patient Identification: Kristi Fuentes MRN:  [id number] Principal Diagnosis: Salicylate overdose,  Bipolar 1 Disorder-current episode  Depressed Diagnosis:   Patient Active Problem List   Diagnosis Date Noted  . HTN (hypertension) [I10] 01/03/2015  . Diabetes [E11.9] 01/03/2015  . Encounter for nasogastric (NG) tube placement [Z46.59]   . Aspiration into airway [T17.998A]   . Encounter for orogastric (OG) tube placement [Z46.59]   . Salicylate overdose [M09.470J] 12/30/2014  . Aspirin overdose [T39.011A] 12/30/2014  . Carcinoma of central portion of female breast [C50.119] 06/21/2013  . Breast cancer, right breast [C50.911] 05/09/2013  . Arm mass [R22.30] 04/11/2011  . 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 female patient admitted with aspirin overdose and depression.  HPI: Patient seen, interviewed in the presence of her husband and chart reviewed. She is  66 years old married female admitted to [facility] with altered mental status and status post suicidal attempt by taking aspirin 325 mg 83 tablets. Patient reports long history of mental illness for which she has seen various psychiatrists and received multiple medications including Lithium which was effective but was discontinued due to renal side effects. She reports that she was initially diagnosed with post partum depression, had history of suicide attempt in 1967. She reports that she has been diagnosed with 6 different cancers since 2005 and survived all of them. But she became overwhelmed and depressed when her doctor found out that her Pancreatic cancer has come back. Prior to that her psychiatric conditions were essentially stable. Patient reports that she attempted  suicide due to worsening depressive symptoms and recurrent thoughts of being a burden to her family. Her husband describes her as easy going and happy person who is well control on her medications. She has been taking her medication as prescribed by Dr. Darleen Crocker at [facility] and also gave consent for coordination of care. Today, patient denies suicidal or homicidal ideations, intent or plan. Her husband is also confident that his wife does not need inpatient psychiatric admission. He believes her depressive symptoms have improved a lot compared with few days ago. However, he believes that his wife will benefit from counseling in addition to medication management. Patient denies psychosis or delusional thinking.   Past Medical History:  Past Medical History  Diagnosis Date  . Diabetes mellitus   . Hypertension   . Chronic kidney disease   . Kidney stones   . Allergy   . Pancreatic cancer     Chemotherapy  . Primary pancreatic neuroendocrine tumor 2013  . Blood transfusion without reported diagnosis   . Heart murmur   . Thyroid disease 01/2002    2 radiaoactive iodine for cancer  . PONV (postoperative nausea and vomiting)   . Hypothyroidism     had thyroid removed 2002  . Cyst (solitary) of breast   . Simple cyst of kidney     unsure of which side maybe on both kidney's  . Incisional hernia   . Anemia   . Depression     takes Lamictal daily  . Status post chemotherapy     pancreatic histrory  . Kidney failure     During chemotherapy for pancreatic cancer  . Breast cancer   .  Sarcoma     Right buttock  . Hernia     Abdominal    Past Surgical History  Procedure Laterality Date  . Buttock sarcoma  2000  . 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 Abdominal hysterectomy    . Total knee arthroplasty      left  . Lipoma excision  04/20/2011    left arm  . Cholecystectomy    . Spleenectomy    . Breast surgery    . Urethral  stricture dilatation  [id number]  . Pressure ulcer buttock  2005    from position during knee surgery   . Breast lumpectomy with needle localization and axillary sentinel lymph node bx Right 05/30/2013    Procedure: BREAST LUMPECTOMY WITH NEEDLE LOCALIZATION AND AXILLARY SENTINEL LYMPH NODE BX;  Surgeon: Harl Bowie, MD;  Location: [place];  Service: General;  Laterality: Right;   Family History:  Family History  Problem Relation Age of Onset  . Diabetes Father   . Kidney disease Father   . Cancer Father     colon  . Cancer Maternal Grandmother     breast   Social History:  History  Alcohol Use No     History  Drug Use No    History   Social History  . Marital Status: Married    Spouse Name: N/A  . Number of Children: 2  . Years of Education: N/A   Social History Main Topics  . Smoking status: Never Smoker   . Smokeless tobacco: Never Used     Comment: never used tobacco  . Alcohol Use: No  . Drug Use: No  . Sexual Activity: Not Currently   Other Topics Concern  . 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                          Allergies:   Allergies  Allergen Reactions  . Metoclopramide Hcl     Tardive dyskinesia-type symptoms  . Ramipril Cough    Labs:  Results for orders placed or performed during the hospital encounter of 12/30/14 (from the past 48 hour(s))  Glucose, capillary     Status: Abnormal   Collection Time: 01/01/15  5:20 PM  Result Value Ref Range   Glucose-Capillary 115 (H) 70 - 99 mg/dL  Glucose, capillary     Status: Abnormal   Collection Time: 01/01/15  8:14 PM  Result Value Ref Range   Glucose-Capillary 135 (H) 70 - 99 mg/dL   Comment 1 Notify RN    Comment 2 Document in Chart   Glucose, capillary     Status: Abnormal   Collection Time: 01/02/15 12:18 AM  Result Value Ref Range   Glucose-Capillary 139 (H) 70 - 99 mg/dL    Comment 1 Notify RN    Comment 2 Document in Chart   Glucose, capillary     Status: Abnormal   Collection Time: 01/02/15  5:11 AM  Result Value Ref Range   Glucose-Capillary 126 (H) 70 - 99 mg/dL  Basic metabolic panel     Status: Abnormal   Collection Time: 01/02/15  7:08 AM  Result Value Ref Range   Sodium 145 135 - 145 mmol/L   Potassium 3.3 (L) 3.5 - 5.1 mmol/L   Chloride 104 96 - 112 mmol/L   CO2 24 19 -  32 mmol/L   Glucose, Bld 124 (H) 70 - 99 mg/dL   BUN 46 (H) 6 - 23 mg/dL   Creatinine, Ser 2.96 (H) 0.50 - 1.10 mg/dL   Calcium 6.8 (L) 8.4 - 10.5 mg/dL   GFR calc non Af Amer 15 (L) >90 mL/min   GFR calc Af Amer 17 (L) >90 mL/min    Comment: (NOTE) The eGFR has been calculated using the CKD EPI equation. This calculation has not been validated in all clinical situations. eGFR's persistently <90 mL/min signify possible Chronic Kidney Disease.    Anion gap 17 (H) 5 - 15  CBC     Status: Abnormal   Collection Time: 01/02/15  7:08 AM  Result Value Ref Range   WBC 14.3 (H) 4.0 - 10.5 K/uL   RBC 2.71 (L) 3.87 - 5.11 MIL/uL   Hemoglobin 8.5 (L) 12.0 - 15.0 g/dL   HCT 26.4 (L) 36.0 - 46.0 %   MCV 97.4 78.0 - 100.0 fL   MCH 31.4 26.0 - 34.0 pg   MCHC 32.2 30.0 - 36.0 g/dL   RDW 13.9 11.5 - 15.5 %   Platelets 249 150 - 400 K/uL  Glucose, capillary     Status: Abnormal   Collection Time: 01/02/15 11:32 AM  Result Value Ref Range   Glucose-Capillary 214 (H) 70 - 99 mg/dL  Glucose, capillary     Status: Abnormal   Collection Time: 01/02/15  5:27 PM  Result Value Ref Range   Glucose-Capillary 272 (H) 70 - 99 mg/dL  Glucose, capillary     Status: Abnormal   Collection Time: 01/02/15 10:12 PM  Result Value Ref Range   Glucose-Capillary 344 (H) 70 - 99 mg/dL  Glucose, capillary     Status: Abnormal   Collection Time: 01/03/15  7:47 AM  Result Value Ref Range   Glucose-Capillary 258 (H) 70 - 99 mg/dL  Glucose, capillary     Status: Abnormal   Collection Time: 01/03/15 11:45  AM  Result Value Ref Range   Glucose-Capillary 236 (H) 70 - 99 mg/dL    Vitals: Blood pressure 153/67, pulse 56, temperature 99.5 F (37.5 C), temperature source Oral, resp. rate 18, height $RemoveBe'5\' 1"'agJmnivvw$  (1.549 m), weight 74.3 kg (163 lb 12.8 oz), SpO2 99 %.  Risk to Self: Is patient at risk for suicide?: Yes Risk to Others:   Prior Inpatient Therapy:   Prior Outpatient Therapy:    Current Facility-Administered Medications  Medication Dose Route Frequency Provider Last Rate Last Dose  . 0.9 %  sodium chloride infusion  100 mL Intravenous PRN Rexene Agent, MD      . 0.9 %  sodium chloride infusion  100 mL Intravenous PRN Rexene Agent, MD      . albuterol (PROVENTIL) (2.5 MG/3ML) 0.083% nebulizer solution 2.5 mg  2.5 mg Nebulization Q3H PRN Donita Brooks, NP      . amLODipine (NORVASC) tablet 10 mg  10 mg Oral Daily Geradine Girt, DO   10 mg at 01/03/15 1159  . amoxicillin-clavulanate (AUGMENTIN) 875-125 MG per tablet 1 tablet  1 tablet Oral Daily Geradine Girt, DO   1 tablet at 01/03/15 0900  . antiseptic oral rinse (CPC / CETYLPYRIDINIUM CHLORIDE 0.05%) solution 7 mL  7 mL Mouth Rinse BID Wilhelmina Mcardle, MD   7 mL at 01/03/15 1000  . atenolol (TENORMIN) tablet 12.5 mg  12.5 mg Oral Daily Jake Church Masters, RPH   12.5 mg at 01/03/15 0900  .  clonazePAM (KLONOPIN) tablet 0.5 mg  0.5 mg Oral Daily PRN Jake Church Masters, [facility]      . feeding supplement (NEPRO CARB STEADY) liquid 237 mL  237 mL Oral PRN Rexene Agent, MD      . haloperidol (HALDOL) tablet 2 mg  2 mg Oral Q6H PRN Geradine Girt, DO      . heparin injection 5,000 Units  5,000 Units Subcutaneous 3 times per day Otho Bellows, MD   5,000 Units at 01/03/15 [phone]  . insulin aspart (novoLOG) injection 0-15 Units  0-15 Units Subcutaneous TID WC Geradine Girt, DO   5 Units at 01/03/15 1259  . insulin aspart (novoLOG) injection 0-5 Units  0-5 Units Subcutaneous QHS Geradine Girt, DO   4 Units at 01/02/15 2223  . insulin aspart (novoLOG)  injection 3 Units  3 Units Subcutaneous BID WC Geradine Girt, DO   3 Units at 01/03/15 1259  . insulin glargine (LANTUS) injection 15 Units  15 Units Subcutaneous QAC supper Geradine Girt, DO      . lamoTRIgine (LAMICTAL) tablet 200 mg  200 mg Oral Daily Alexa Sherral Hammers, MD   200 mg at 01/03/15 0900  . levothyroxine (SYNTHROID, LEVOTHROID) tablet 150 mcg  150 mcg Oral QAC breakfast Alexa Sherral Hammers, MD   150 mcg at 01/03/15 0859  . ondansetron (ZOFRAN) injection 4 mg  4 mg Intravenous Q6H PRN Donita Brooks, NP   4 mg at 01/02/15 1214  . potassium chloride SA (K-DUR,KLOR-CON) CR tablet 40 mEq  40 mEq Oral Once Alexa Sherral Hammers, MD   40 mEq at 01/01/15 1223    Musculoskeletal: Strength & Muscle Tone: decreased Gait & Station: normal, unable to stand Patient leans: N/A  Psychiatric Specialty Exam: Physical Exam as per history and physical   ROS depression, suicidal thoughts, isolated, withdrawn and status post suicidal attempt, generalized weakness, pain on her right eye and guarded.   Blood pressure 153/67, pulse 56, temperature 99.5 F (37.5 C), temperature source Oral, resp. rate 18, height $RemoveBe'5\' 1"'pKFHLApjr$  (1.549 m), weight 74.3 kg (163 lb 12.8 oz), SpO2 99 %.Body mass index is 30.97 kg/(m^2).  General Appearance: Casual and dress in hospital gown  Eye Contact::  Good  Speech:  Clear and Coherent and Slow  Volume:  Normal  Mood:  Dysphoric  Affect:  Appropriate  Thought Process:  Coherent and Goal Directed  Orientation:  Full (Time, Place, and Person)  Thought Content:  WDL  Suicidal Thoughts:  No  Homicidal Thoughts:  No  Memory:  Immediate;   Good Recent;   Good Remote;   Good  Judgement:  Fair  Insight:  Fair  Psychomotor Activity:  Decreased  Concentration:  Fair  Recall:  [place] of Knowledge:Good  Language: Good  Akathisia:  Negative  Handed:  Right  AIMS (if indicated):     Assets:  Communication Skills Desire for Improvement Financial  Resources/Insurance Housing Intimacy Leisure Time Physical Health Resilience Social Support Talents/Skills Transportation  ADL's:  Impaired  Cognition: WNL  Sleep:   fair   Medical Decision Making: Established Problem, Stable/Improving (1), Review or order clinical lab tests (1), Review of Medication Regimen & Side Effects (2) and Review of New Medication or Change in Dosage (2)  Treatment Plan Summary: Daily contact with patient to assess and evaluate symptoms and progress in treatment and Medication management  Plan:  Patient is able to contract for safety, No evidence of imminent risk to  self or others at present.   Patient does not meet criteria for psychiatric inpatient admission.  Bipolar depression:  Continue Lamotrigine 200 mg daily and continue Synthroid 150 micrograms for hypothyroidism Appreciate psychiatric consultation and follow up as clinically required Please contact [phone] or [phone] if needs further assistance  Disposition: Please schedule patient to follow up with Dr. Casimiro Needle at [facility] upon discharge/when medically cleared.   Corena Pilgrim , MD  01/03/2015 3:20 PM

## 2015-01-03 NOTE — Progress Notes (Signed)
CSW has referred patient for inpatient psych placement at the following facilities:  Facilities with available beds: [facility] per [facility] per [facility] per [facility] per [facility]  Facilities at capacity:  [facility] per [facility] per [facility] per [facility]  CSW consulted with the Attending who states patient is medically stable for transfer.  CSW will be available as needed to assist with placement needs.  [facility] Ma[facility]e Ullman [facility] CSW [phone]

## 2015-01-04 DIAGNOSIS — I10 Essential (primary) hypertension: Secondary | ICD-10-CM

## 2015-01-04 LAB — CBC
HEMATOCRIT: 27.2 % — AB (ref 36.0–46.0)
Hemoglobin: 8.9 g/dL — ABNORMAL LOW (ref 12.0–15.0)
MCH: 31.2 pg (ref 26.0–34.0)
MCHC: 32.7 g/dL (ref 30.0–36.0)
MCV: 95.4 fL (ref 78.0–100.0)
Platelets: 284 10*3/uL (ref 150–400)
RBC: 2.85 MIL/uL — AB (ref 3.87–5.11)
RDW: 13.9 % (ref 11.5–15.5)
WBC: 10.1 10*3/uL (ref 4.0–10.5)

## 2015-01-04 LAB — BASIC METABOLIC PANEL
Anion gap: 11 (ref 5–15)
BUN: 42 mg/dL — AB (ref 6–20)
CHLORIDE: 106 mmol/L (ref 101–111)
CO2: 26 mmol/L (ref 22–32)
CREATININE: 2.79 mg/dL — AB (ref 0.44–1.00)
Calcium: 6.6 mg/dL — ABNORMAL LOW (ref 8.9–10.3)
GFR calc non Af Amer: 16 mL/min — ABNORMAL LOW (ref >60)
GFR, EST AFRICAN AMERICAN: 19 mL/min — AB (ref >60)
GLUCOSE: 156 mg/dL — AB (ref 70–99)
POTASSIUM: 3.4 mmol/L — AB (ref 3.5–5.1)
Sodium: 143 mmol/L (ref 135–145)

## 2015-01-04 LAB — GLUCOSE, CAPILLARY
GLUCOSE-CAPILLARY: 191 mg/dL — AB (ref 70–99)
Glucose-Capillary: 154 mg/dL — ABNORMAL HIGH (ref 70–99)
Glucose-Capillary: 208 mg/dL — ABNORMAL HIGH (ref 70–99)
Glucose-Capillary: 187 mg/dL — ABNORMAL HIGH (ref 70–99)

## 2015-01-04 MED ORDER — SODIUM CHLORIDE 0.9 % IJ SOLN: 3.0000 mL | INTRAMUSCULAR | Status: DC | PRN

## 2015-01-04 MED ORDER — POTASSIUM CHLORIDE CRYS ER 20 MEQ PO TBCR
40.0000 meq | EXTENDED_RELEASE_TABLET | Freq: Once | ORAL | Status: AC
  Administered 2015-01-04: 40 meq via ORAL

## 2015-01-04 MED ORDER — SODIUM CHLORIDE 0.9 % IJ SOLN
3.0000 mL | Freq: Two times a day (BID) | INTRAMUSCULAR | Status: DC
  Administered 2015-01-04 (×2): 3 mL via INTRAVENOUS

## 2015-01-04 MED ORDER — ZOLPIDEM TARTRATE 5 MG PO TABS: 5.0000 mg | ORAL_TABLET | Freq: Every evening | ORAL | Status: DC | PRN

## 2015-01-04 NOTE — Progress Notes (Signed)
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 Marin Fuentes, on tamoxifen only, who presented to the [facility] ER on 4/26 with an aspirin overdose from suicide attempt. Salicylate level  normal and patient extubated. Tx from PCCM to [facility] on 4/29  Assessment/Plan:  ASA OD -cleared from a psych point of view to go home and follow with her psych dr Kristi Fuentes  AKI on CKD IV- Cr baseline was 2 -s/p HD -monitor -renal signed off  Leukocytosis ? PNA- aspiration- with addition of augmentin, resolved  Anemia  DM -SSI -lantus  Hypothyroid -synthroid -TSH ok  Aspiration PNA -added augmentin  Home in AM after appointment made with psych dr.: Kristi Fuentes   Code Status: full Family Communication: family at bedside 5/1 Disposition Plan: home in AM   Consultants:  PCCM  Psych  renal  Procedures:      HPI/Subjective: Much more engaged and insightful  Objective: Filed Vitals:   01/04/15 0529  BP: 143/53  Pulse: 56  Temp: 98.9 F (37.2 C)  Resp: 18    Intake/Output Summary (Last 24 hours) at 01/04/15 1203 Last data filed at 01/04/15 1000  Gross per 24 hour  Intake    920 ml  Output    850 ml  Net     70 ml   Filed Weights   01/02/15 0523 01/03/15 0605 01/04/15 0529  Weight: 77 kg (169 lb 12.1 oz) 74.3 kg (163 lb 12.8 oz) 75 kg (165 lb 5.5 oz)    Exam:   General: A+Ox3, NAD  Cardiovascular: rrr  Respiratory: clear  Abdomen: +BS, soft  Musculoskeletal: no edmea   Data Reviewed: Basic Metabolic Panel:  Recent Labs Lab 12/30/14 1634 12/31/14 0440 01/01/15 0315 01/02/15 0708 01/04/15 0642  NA 145 137 139 145 143  K 4.4 3.2* 3.5 3.3* 3.4*   CL 104 95* 94* 104 106  CO2 18* 33* 31 24 26   GLUCOSE 125* 128* 149* 124* 156*  BUN 72* 15 29* 46* 42*  CREATININE 3.64* 1.81* 3.15* 2.96* 2.79*  CALCIUM 8.3* 6.9* 6.8* 6.8* 6.6*   Liver Function Tests:  Recent Labs Lab 12/30/14 1122 12/30/14 2014 01/01/15 0315  AST 34  --  51*  ALT 26  --  32  ALKPHOS 78  --  60  BILITOT 0.4  --  0.5  PROT 7.5  --  5.4*  ALBUMIN 3.9 3.0* 2.9*   No results for input(s): LIPASE, AMYLASE in the last 168 hours. No results for input(s): AMMONIA in the last 168 hours. CBC:  Recent Labs Lab 12/30/14 1122 12/31/14 0440 01/01/15 0315 01/02/15 0708 01/04/15 0642  WBC 17.6* 17.2* 16.4* 14.3* 10.1  NEUTROABS 16.3*  --   --   --   --   HGB 11.2* 8.3* 8.8* 8.5* 8.9*  HCT 34.5* 24.7* 27.5* 26.4* 27.2*  MCV 96.9 94.6 97.2 97.4 95.4  PLT 323 247 256 249 284   Cardiac Enzymes: No results for input(s): CKTOTAL, CKMB, CKMBINDEX, TROPONINI in the last 168 hours. BNP (last 3 results) No results for input(s): BNP in the last 8760 hours.  ProBNP (last 3 results) No results for input(s): PROBNP in the last 8760 hours.  CBG:  Recent Labs Lab 01/03/15 [phone]  01/03/15 1145 01/03/15 1648 01/03/15 2147 01/04/15 0749  GLUCAP 258* 236* 228* 195* 154*    Recent Results (from the past 240 hour(s))  MRSA PCR Screening     Status: None   Collection Time: 12/30/14  3:09 PM  Result Value Ref Range Status   MRSA by PCR NEGATIVE NEGATIVE Final    Comment:        The GeneXpert MRSA Assay (FDA approved for NASAL specimens only), is one component of a comprehensive MRSA colonization surveillance program. It is not intended to diagnose MRSA infection nor to guide or monitor treatment for MRSA infections.      Studies: No results found.  Scheduled Meds: . amLODipine  10 mg Oral Daily  . amoxicillin-clavulanate  1 tablet Oral Daily  . antiseptic oral rinse  7 mL Mouth Rinse BID  . atenolol  12.5 mg Oral Daily  . heparin subcutaneous  5,000  Units Subcutaneous 3 times per day  . insulin aspart  0-15 Units Subcutaneous TID WC  . insulin aspart  0-5 Units Subcutaneous QHS  . insulin aspart  3 Units Subcutaneous BID WC  . insulin glargine  15 Units Subcutaneous QAC supper  . lamoTRIgine  200 mg Oral Daily  . levothyroxine  150 mcg Oral QAC breakfast  . potassium chloride  40 mEq Oral Once  . sodium chloride  3 mL Intravenous Q12H   Continuous Infusions:   Antibiotics Given (last 72 hours)    Date/Time Action Medication Dose   01/02/15 1242 Given   amoxicillin-clavulanate (AUGMENTIN) 875-125 MG per tablet 1 tablet 1 tablet   01/03/15 0900 Given   amoxicillin-clavulanate (AUGMENTIN) 875-125 MG per tablet 1 tablet 1 tablet   01/04/15 0855 Given   amoxicillin-clavulanate (AUGMENTIN) 875-125 MG per tablet 1 tablet 1 tablet      Principal Problem:   Salicylate overdose Active Problems:   Aspirin overdose   Aspiration into airway   Encounter for orogastric (OG) tube placement   Encounter for nasogastric (NG) tube placement   HTN (hypertension)   Diabetes   Bipolar I disorder, most recent episode depressed    Time spent: 25 min    Kristi Fuentes  [facility] Pager [phone]. If 7PM-7AM, please contact night-coverage at [email or web address], password [facility] 01/04/2015, 12:03 PM  LOS: 5 days

## 2015-01-04 NOTE — Progress Notes (Signed)
Patient complaining of feeling "as if my sugar is low".  States she felt "clammy".  CBG = 191.  Assisted back to bed.  Other V/S stable.  She states she felt better once in bed.  No further action required.

## 2015-01-05 DIAGNOSIS — J690 Pneumonitis due to inhalation of food and vomit: Secondary | ICD-10-CM

## 2015-01-05 LAB — GLUCOSE, CAPILLARY
Glucose-Capillary: 122 mg/dL — ABNORMAL HIGH (ref 70–99)
Glucose-Capillary: 91 mg/dL (ref 70–99)

## 2015-01-05 MED ORDER — AMOXICILLIN-POT CLAVULANATE 875-125 MG PO TABS: 1.0000 | ORAL_TABLET | Freq: Every day | ORAL | Status: DC

## 2015-01-05 MED ORDER — ZOLPIDEM TARTRATE 5 MG PO TABS: 5.0000 mg | ORAL_TABLET | Freq: Every evening | ORAL | Status: AC | PRN

## 2015-01-05 NOTE — Discharge Summary (Signed)
Physician Discharge Summary  Kristi Fuentes OZD:[id number] DOB: 1946/02/11 DOA: 12/30/2014  PCP: Dwan Bolt, MD  Admit date: 12/30/2014 Discharge date: 01/05/2015  Time spent: [facility] 30 minutes  Recommendations for Outpatient Follow-up:  1. Close follow up with her psychiatrist has been arranged  Discharge Diagnoses:  Principal Problem:   Salicylate overdose Active Problems:   Aspirin overdose   Aspiration into airway   HTN (hypertension)   Diabetes   Bipolar I disorder, most recent episode depressed   Discharge Condition: stable  Diet recommendation: heart healthy diabetic  Filed Weights   01/02/15 0523 01/03/15 0605 01/04/15 0529  Weight: 77 kg (169 lb 12.1 oz) 74.3 kg (163 lb 12.8 oz) 75 kg (165 lb 5.5 oz)    History of present illness/Hospital Course:  69 y/o F with PMH of HTN, DM, CKD (followed at [facility]), hypothyroidism, anemia, depression, 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 Marin Olp, on tamoxifen only, who presented to the [place] on 4/26 with an aspirin overdose from suicide attempt.  Admitted to [facility] with nephrology consulting. Tx from PCCM to [facility] on 4/29  ASA OD: initially admitted to PCCM Intubated, dialysis line placed, had dialysis Psychiatry consulted. Patient able to contract for safety, and they have recommended disharge with outpt psych follow up  AKI on CKD IV- Cr baseline was 2 Improved by discharge  Leukocytosis ? PNA- aspiration addition of augmentin,  Anemia  DM -SSI -lantus  Hypothyroid -synthroid -TSH ok  Procedures:  Intubation/mechanical ventilation  Right femoral dialysis cather  hemodialsysl  Consultations:  pccm  Nephrology  psychiatry  Discharge Exam: Filed Vitals:   01/05/15 0530  BP: 146/66  Pulse: 57  Temp: 98.4 F (36.9 C)  Resp: 18    General:talkative a and  o Cardiovascular: RRR Respiratory: CTA  Discharge Instructions   Discharge Instructions    Activity as tolerated - No restrictions    Complete by:  As directed      Diet - low sodium heart healthy    Complete by:  As directed           Current Discharge Medication List    START taking these medications   Details  amoxicillin-clavulanate (AUGMENTIN) 875-125 MG per tablet Take 1 tablet by mouth daily. Qty: 6 tablet, Refills: 0      CONTINUE these medications which have CHANGED   Details  zolpidem (AMBIEN) 5 MG tablet Take 1 tablet (5 mg total) by mouth at bedtime as needed for sleep. Qty: 30 tablet, Refills: 0      CONTINUE these medications which have NOT CHANGED   Details  amLODipine (NORVASC) 10 MG tablet Take 10 mg by mouth daily.   Associated Diagnoses: Breast cancer, right breast; Carcinoma of central portion of female breast    atenolol (TENORMIN) 25 MG tablet Take 12.5 mg by mouth daily. Takes 1/2 pill to = 12.5 mg. daily    calcium carbonate (TUMS EX) 750 MG chewable tablet Chew 1 tablet by mouth 2 (two) times daily.    clonazePAM (KLONOPIN) 0.5 MG tablet Take 0.5 mg by mouth as needed for anxiety.   Associated Diagnoses: Carcinoma of central portion of female breast, right    insulin aspart (NOVOLOG) 100 UNIT/ML injection Inject 3 Units into the skin 2 (two) times daily. Takes before breakfast and lunch.    insulin glargine (LANTUS) 100 UNIT/ML injection Inject 22  Units into the skin daily before supper.    lamoTRIgine (LAMICTAL) 200 MG tablet Take 200 mg by mouth daily.     levothyroxine (SYNTHROID, LEVOTHROID) 150 MCG tablet Take 150 mcg by mouth daily.      loratadine (CLARITIN) 10 MG tablet Take 10 mg by mouth daily.      pravastatin (PRAVACHOL) 80 MG tablet Take 80 mg by mouth daily.      tamoxifen (NOLVADEX) 20 MG tablet Take 1 tablet (20 mg total) by mouth daily. Qty: 90 tablet, Refills: 3   Associated Diagnoses: Carcinoma of central portion of  female breast, right      STOP taking these medications     acetaminophen (TYLENOL) 500 MG tablet        Allergies  Allergen Reactions  . Metoclopramide Hcl     Tardive dyskinesia-type symptoms  . Ramipril Cough   Follow-up Information    Follow up with Haskel Schroeder, MD On 01/21/2015.   Specialty:  Psychiatry   Why:  Appointment with Dr. Casimiro Needle will be on May 18 at 8:30am   Contact information:   [address] [place] [address] [phone]        The results of significant diagnostics from this hospitalization (including imaging, microbiology, ancillary and laboratory) are listed below for reference.    Significant Diagnostic Studies: Ct Head Wo Contrast  12/30/2014   CLINICAL DATA:  Fall. Hematoma over the right eye. Lethargy. Pancreatic cancer. Possible suicide attempt.  EXAM: CT HEAD WITHOUT CONTRAST  CT CERVICAL SPINE WITHOUT CONTRAST  TECHNIQUE: Multidetector CT imaging of the head and cervical spine was performed following the standard protocol without intravenous contrast. Multiplanar CT image reconstructions of the cervical spine were also generated.  COMPARISON:  CT head and cervical spine 08/07/2009  FINDINGS: CT HEAD FINDINGS  Mild atrophy is within normal limits for age. No acute infarct, hemorrhage, or mass lesion is present.  A right periorbital hematoma and soft tissue swelling is present. There is no underlying fracture. The paranasal sinuses and mastoid air cells are clear. The calvarium is intact.  CT CERVICAL SPINE FINDINGS  Acquired fusion across the disc space and posterior elements at C3-4 is stable. Degenerative changes are present throughout the cervical spine. Grade 1 anterolisthesis at C6-7 is stable. No acute fracture or traumatic subluxation is evident.  The soft tissues of the neck are unremarkable. The lung apices are clear.  IMPRESSION: 1. Right periorbital hematoma and soft tissue swelling without an underlying fracture. 2.  Normal CT appearance of the brain. 3. Mild multilevel degenerative changes in the cervical spine are similar to the prior study. 4. Acquired fusion at C3-4 is stable. 5. No acute fracture or traumatic subluxation within the cervical spine.   Electronically Signed   By: San Morelle M.D.   On: 12/30/2014 11:51   Ct Cervical Spine Wo Contrast  12/30/2014   CLINICAL DATA:  Fall. Hematoma over the right eye. Lethargy. Pancreatic cancer. Possible suicide attempt.  EXAM: CT HEAD WITHOUT CONTRAST  CT CERVICAL SPINE WITHOUT CONTRAST  TECHNIQUE: Multidetector CT imaging of the head and cervical spine was performed following the standard protocol without intravenous contrast. Multiplanar CT image reconstructions of the cervical spine were also generated.  COMPARISON:  CT head and cervical spine 08/07/2009  FINDINGS: CT HEAD FINDINGS  Mild atrophy is within normal limits for age. No acute infarct, hemorrhage, or mass lesion is present.  A right periorbital hematoma and soft tissue swelling is present.  There is no underlying fracture. The paranasal sinuses and mastoid air cells are clear. The calvarium is intact.  CT CERVICAL SPINE FINDINGS  Acquired fusion across the disc space and posterior elements at C3-4 is stable. Degenerative changes are present throughout the cervical spine. Grade 1 anterolisthesis at C6-7 is stable. No acute fracture or traumatic subluxation is evident.  The soft tissues of the neck are unremarkable. The lung apices are clear.  IMPRESSION: 1. Right periorbital hematoma and soft tissue swelling without an underlying fracture. 2. Normal CT appearance of the brain. 3. Mild multilevel degenerative changes in the cervical spine are similar to the prior study. 4. Acquired fusion at C3-4 is stable. 5. No acute fracture or traumatic subluxation within the cervical spine.   Electronically Signed   By: San Morelle M.D.   On: 12/30/2014 11:51   Dg Chest Port 1 View  01/01/2015   CLINICAL  DATA:  Aspiration pneumonia.  EXAM: PORTABLE CHEST - 1 VIEW  COMPARISON:  12/31/2014, 12/30/2014, and 05/23/2013  FINDINGS: Endotracheal tube and NG tube have been removed.  Small focal area of infiltrate/ atelectasis at the left base. Minimal atelectasis at the right base. Chronic elevation of the left hemidiaphragm, unchanged since 05/23/2013.  Heart size and pulmonary vascularity are normal. Calcified lymph node adjacent to the carina.  IMPRESSION: Small infiltrate/atelectasis at the left lung base, slightly increased.   Electronically Signed   By: Lorriane Shire M.D.   On: 01/01/2015 08:28   Dg Chest Port 1 View  12/31/2014   CLINICAL DATA:  Aspirin overdose.  EXAM: PORTABLE CHEST - 1 VIEW  COMPARISON:  12/30/2014.  FINDINGS: Endotracheal tube in stable position. Interim placement of NG tube. NG tube tip is projected over the lower chest, most likely in the distal esophagus. Further advancement suggested. Calcified right hilar lymph node. Low lung volumes with basilar atelectasis. Mild cardiomegaly pulmonary vascular prominence. Stable elevation left hemidiaphragm. No pneumothorax. No acute bony abnormality.  IMPRESSION: 1. Interim placement of NG tube. Its tip is in the lower chest, presumably in the lower esophagus. Further advanced min is suggested. Endotracheal tube in stable position. 2. Mild cardiomegaly with mild pulmonary vascular prominence. 3. Low lung volumes with basilar atelectasis . Critical Value/emergent results were called by telephone at the time of interpretation on 12/31/2014 at 7:19 am to nurse Olean Ree , who verbally acknowledged these results.   Electronically Signed   By: Marcello Moores  Register   On: 12/31/2014 07:22   Portable Chest Xray  12/30/2014   CLINICAL DATA:  ET tube placement  EXAM: PORTABLE CHEST - 1 VIEW  COMPARISON:  None.  FINDINGS: There is elevation of the left diaphragm. There is no focal parenchymal opacity. There is no pleural effusion or pneumothorax.  Normal  cardiomediastinal silhouette.  No acute osseus abnormality.  Endotracheal tube with the tip 4.5 cm above the carina.  IMPRESSION: Endotracheal tube with the tip 4.5 cm above the carina.   Electronically Signed   By: Kathreen Devoid   On: 12/30/2014 18:00   Dg Abd Portable 1v  12/31/2014   CLINICAL DATA:  Nasogastric tube placement  EXAM: PORTABLE ABDOMEN - 1 VIEW  COMPARISON:  December 30, 2014  FINDINGS: Nasogastric tube tip is at the gastroesophageal junction. Bowel gas pattern unremarkable. There is atelectasis in the left base region.  IMPRESSION: Nasogastric tube tip is at the gastroesophageal junction. Advise advancing nasogastric tube 8 to 10 cm to insure placement of the tube tip and side-port in the  stomach. Bowel gas pattern unremarkable.  These results will be called to the ordering clinician or representative by the Radiologist Assistant, and communication documented in the PACS or zVision Dashboard.   Electronically Signed   By: Lowella Grip III M.D.   On: 12/31/2014 09:48   Dg Abd Portable 1v  12/31/2014   CLINICAL DATA:  Orogastric tube placement.  Initial encounter.  EXAM: PORTABLE ABDOMEN - 1 VIEW  COMPARISON:  Abdominal radiograph performed 10/16/2011  FINDINGS: The patient's enteric tube is noted ending overlying the distal esophagus. This should be advanced at least 10 cm.  The visualized bowel gas pattern is grossly unremarkable. Scattered air-filled loops of small and large bowel are seen, without evidence of bowel obstruction.  Mild degenerative change is noted along the lumbar spine. No acute osseous abnormalities are seen.  IMPRESSION: Enteric tube noted ending overlying the distal esophagus. This should be advanced at least 10 cm, as deemed clinically appropriate.   Electronically Signed   By: Garald Balding M.D.   On: 12/31/2014 02:09    Microbiology: Recent Results (from the past 240 hour(s))  MRSA PCR Screening     Status: None   Collection Time: 12/30/14  3:09 PM  Result  Value Ref Range Status   MRSA by PCR NEGATIVE NEGATIVE Final    Comment:        The GeneXpert MRSA Assay (FDA approved for NASAL specimens only), is one component of a comprehensive MRSA colonization surveillance program. It is not intended to diagnose MRSA infection nor to guide or monitor treatment for MRSA infections.      Labs: Basic Metabolic Panel:  Recent Labs Lab 12/30/14 1634 12/31/14 0440 01/01/15 0315 01/02/15 0708 01/04/15 0642  NA 145 137 139 145 143  K 4.4 3.2* 3.5 3.3* 3.4*  CL 104 95* 94* 104 106  CO2 18* 33* 31 24 26   GLUCOSE 125* 128* 149* 124* 156*  BUN 72* 15 29* 46* 42*  CREATININE 3.64* 1.81* 3.15* 2.96* 2.79*  CALCIUM 8.3* 6.9* 6.8* 6.8* 6.6*   Liver Function Tests:  Recent Labs Lab 12/30/14 1122 12/30/14 2014 01/01/15 0315  AST 34  --  51*  ALT 26  --  32  ALKPHOS 78  --  60  BILITOT 0.4  --  0.5  PROT 7.5  --  5.4*  ALBUMIN 3.9 3.0* 2.9*   No results for input(s): LIPASE, AMYLASE in the last 168 hours. No results for input(s): AMMONIA in the last 168 hours. CBC:  Recent Labs Lab 12/30/14 1122 12/31/14 0440 01/01/15 0315 01/02/15 0708 01/04/15 0642  WBC 17.6* 17.2* 16.4* 14.3* 10.1  NEUTROABS 16.3*  --   --   --   --   HGB 11.2* 8.3* 8.8* 8.5* 8.9*  HCT 34.5* 24.7* 27.5* 26.4* 27.2*  MCV 96.9 94.6 97.2 97.4 95.4  PLT 323 247 256 249 284   Cardiac Enzymes: No results for input(s): CKTOTAL, CKMB, CKMBINDEX, TROPONINI in the last 168 hours. BNP: BNP (last 3 results) No results for input(s): BNP in the last 8760 hours.  ProBNP (last 3 results) No results for input(s): PROBNP in the last 8760 hours.  CBG:  Recent Labs Lab 01/04/15 0749 01/04/15 1403 01/04/15 1623 01/04/15 2136 01/05/15 0803  GLUCAP 154* 208* 191* 187* 122*       Signed:  Adrion Menz L  [facility] 01/05/2015, 10:17 AM

## 2015-01-05 NOTE — Clinical Social Work Psych Note (Signed)
Psych CSW contacted MD Plovsky ([place]) [phone] to confirm follow up appointment had been made.  Follow-up appointment has been set for May 18 at 8:30am.  This information was placed on the discharge summary for patient's convenience.  Patient is agreeable to follow up.  Nonnie Done, LCSW [phone]  Psychiatric & Orthopedics (5N 1-8) Clinical Social Worker

## 2015-01-05 NOTE — Progress Notes (Signed)
Reviewed discharge instructions with pt and husband.  Pt denied any other needs at this time.  Volunteer services contacted for wheelchair to discharge location.

## 2015-01-05 NOTE — Consult Note (Signed)
Psychiatry Consult  follow-up   Reason for Consult:  Status post Salicylate overdose and Depression  Referring Physician:  Dr. Sung Amabile / Dr. Senaida Ores Patient Identification: Kristi Fuentes MRN:  [id number] Principal Diagnosis: Salicylate overdose,  Bipolar 1 Disorder-current episode  Depressed Diagnosis:   Patient Active Problem List   Diagnosis Date Noted  . Aspiration pneumonia [J69.0]   . HTN (hypertension) [I10] 01/03/2015  . Diabetes [E11.9] 01/03/2015  . Bipolar I disorder, most recent episode depressed [F31.30]   . Encounter for nasogastric (NG) tube placement [Z46.59]   . Aspiration into airway [T17.998A]   . Encounter for orogastric (OG) tube placement [Z46.59]   . Salicylate overdose [T39.091A] 12/30/2014  . Aspirin overdose [T39.011A] 12/30/2014  . Carcinoma of central portion of female breast [C50.119] 06/21/2013  . Breast cancer, right breast [C50.911] 05/09/2013  . Arm mass [R22.30] 04/11/2011  . 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 female patient admitted with aspirin overdose and depression.  HPI: Patient seen, interviewed in the presence of her husband and chart reviewed. She is  53 years old married female admitted to [facility] with altered mental status and status post suicidal attempt by taking aspirin 325 mg 83 tablets. Patient reports long history of mental illness for which she has seen various psychiatrists and received multiple medications including Lithium which was effective but was discontinued due to renal side effects. She reports that she was initially diagnosed with post partum depression, had history of suicide attempt in 1967. She reports that she has been diagnosed with 6 different cancers since 2005 and survived all of them. But she became overwhelmed and depressed when her doctor found out that her Pancreatic cancer has come back.  Prior to that her psychiatric conditions were essentially stable. Patient reports that she attempted suicide due to worsening depressive symptoms and recurrent thoughts of being a burden to her family. Her husband describes her as easy going and happy person who is well control on her medications. She has been taking her medication as prescribed by Dr. Merrilee Jansky at [facility] and also gave consent for coordination of care. Today, patient denies suicidal or homicidal ideations, intent or plan. Her husband is also confident that his wife does not need inpatient psychiatric admission. He believes her depressive symptoms have improved a lot compared with few days ago. However, he believes that his wife will benefit from counseling in addition to medication management. Patient denies psychosis or delusional thinking.   Interval history: Patient is known to this provider from the last week and reviewed Dr. Jannifer Franklin evaluation and recommendation. Patient has been stable with her mood swings and has cleared sensorium. Patient husband was at bedside stated that patient children would like to be involved in her psychiatric care and like to meet her primary psychiatrist. Patient has denied current symptoms of depression, mania, psychosis, suicidal/homicidal ideation, intention or plans. Patient contract for safety and willing to follow up with outpatient psychiatric services. Patient is also willing to receive outpatient counseling services when appropriate.    Medical History:  Past Medical History  Diagnosis Date  . Diabetes mellitus   . Hypertension   . Chronic kidney disease   . Kidney stones   . Allergy   . Pancreatic cancer     Chemotherapy  . Primary pancreatic neuroendocrine tumor 2013  . Blood  transfusion without reported diagnosis   . Heart murmur   . Thyroid disease 01/2002    2 radiaoactive iodine for cancer  . PONV (postoperative nausea and vomiting)   .  Hypothyroidism     had thyroid removed 2002  . Cyst (solitary) of breast   . Simple cyst of kidney     unsure of which side maybe on both kidney's  . Incisional hernia   . Anemia   . Depression     takes Lamictal daily  . Status post chemotherapy     pancreatic histrory  . Kidney failure     During chemotherapy for pancreatic cancer  . Breast cancer   . Sarcoma     Right buttock  . Hernia     Abdominal    Past Surgical History  Procedure Laterality Date  . Buttock sarcoma  2000  . 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 Abdominal hysterectomy    . Total knee arthroplasty      left  . Lipoma excision  04/20/2011    left arm  . Cholecystectomy    . Spleenectomy    . Breast surgery    . Urethral stricture dilatation  [id number]  . Pressure ulcer buttock  2005    from position during knee surgery   . Breast lumpectomy with needle localization and axillary sentinel lymph node bx Right 05/30/2013    Procedure: BREAST LUMPECTOMY WITH NEEDLE LOCALIZATION AND AXILLARY SENTINEL LYMPH NODE BX;  Surgeon: Harl Bowie, MD;  Location: [facility];  Service: General;  Laterality: Right;   Family History:  Family History  Problem Relation Age of Onset  . Diabetes Father   . Kidney disease Father   . Cancer Father     colon  . Cancer Maternal Grandmother     breast   Social History:  History  Alcohol Use No     History  Drug Use No    History   Social History  . Marital Status: Married    Spouse Name: N/A  . Number of Children: 2  . Years of Education: N/A   Social History Main Topics  . Smoking status: Never Smoker   . Smokeless tobacco: Never Used     Comment: never used tobacco  . Alcohol Use: No  . Drug Use: No  . Sexual Activity: Not Currently   Other Topics Concern  . 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                           Allergies:   Allergies  Allergen Reactions  . Metoclopramide Hcl     Tardive dyskinesia-type symptoms  . Ramipril Cough    Labs:  Results for orders placed or performed during the hospital encounter of 12/30/14 (from the past 48 hour(s))  Glucose, capillary     Status: Abnormal   Collection Time: 01/03/15  4:48 PM  Result Value Ref Range   Glucose-Capillary 228 (H) 70 - 99 mg/dL  Glucose, capillary     Status: Abnormal   Collection Time: 01/03/15  9:47 PM  Result Value Ref Range   Glucose-Capillary 195 (H) 70 - 99 mg/dL   Comment 1 Notify RN    Comment 2 Document in Chart   CBC     Status: Abnormal   Collection Time: 01/04/15  6:42 AM  Result Value Ref Range   WBC 10.1 4.0 - 10.5 K/uL   RBC 2.85 (L) 3.87 - 5.11 MIL/uL   Hemoglobin 8.9 (L) 12.0 - 15.0 g/dL   HCT 27.2 (L) 36.0 - 46.0 %   MCV 95.4 78.0 - 100.0 fL   MCH 31.2 26.0 - 34.0 pg   MCHC 32.7 30.0 - 36.0 g/dL   RDW 13.9 11.5 - 15.5 %   Platelets 284 150 - 400 K/uL  Basic metabolic panel     Status: Abnormal   Collection Time: 01/04/15  6:42 AM  Result Value Ref Range   Sodium 143 135 - 145 mmol/L   Potassium 3.4 (L) 3.5 - 5.1 mmol/L   Chloride 106 101 - 111 mmol/L   CO2 26 22 - 32 mmol/L   Glucose, Bld 156 (H) 70 - 99 mg/dL   BUN 42 (H) 6 - 20 mg/dL   Creatinine, Ser 2.79 (H) 0.44 - 1.00 mg/dL   Calcium 6.6 (L) 8.9 - 10.3 mg/dL   GFR calc non Af Amer 16 (L) >60 mL/min   GFR calc Af Amer 19 (L) >60 mL/min    Comment: (NOTE) The eGFR has been calculated using the CKD EPI equation. This calculation has not been validated in all clinical situations. eGFR's persistently <90 mL/min signify possible Chronic Kidney Disease.    Anion gap 11 5 - 15  Glucose, capillary     Status: Abnormal   Collection Time: 01/04/15  7:49 AM  Result Value Ref Range   Glucose-Capillary 154 (H) 70 - 99 mg/dL  Glucose, capillary     Status: Abnormal   Collection Time: 01/04/15  2:03 PM  Result Value Ref Range    Glucose-Capillary 208 (H) 70 - 99 mg/dL  Glucose, capillary     Status: Abnormal   Collection Time: 01/04/15  4:23 PM  Result Value Ref Range   Glucose-Capillary 191 (H) 70 - 99 mg/dL  Glucose, capillary     Status: Abnormal   Collection Time: 01/04/15  9:36 PM  Result Value Ref Range   Glucose-Capillary 187 (H) 70 - 99 mg/dL  Glucose, capillary     Status: Abnormal   Collection Time: 01/05/15  8:03 AM  Result Value Ref Range   Glucose-Capillary 122 (H) 70 - 99 mg/dL  Glucose, capillary     Status: None   Collection Time: 01/05/15 11:57 AM  Result Value Ref Range   Glucose-Capillary 91 70 - 99 mg/dL    Vitals: Blood pressure 135/69, pulse 57, temperature 98.4 F (36.9 C), temperature source Oral, resp. rate 18, height $RemoveBe'5\' 1"'lsqdYNuQj$  (1.549 m), weight 75 kg (165 lb 5.5 oz), SpO2 100 %.  Risk to Self: Is patient at risk for suicide?: Yes Risk to Others:   Prior Inpatient Therapy:   Prior Outpatient Therapy:    Current Facility-Administered Medications  Medication Dose Route Frequency Provider Last Rate Last Dose  . albuterol (PROVENTIL) (2.5 MG/3ML) 0.083% nebulizer solution 2.5 mg  2.5 mg Nebulization Q3H PRN Donita Brooks, NP      . amLODipine (NORVASC) tablet 10 mg  10 mg Oral Daily Geradine Girt, DO   10 mg at 01/05/15 1059  . amoxicillin-clavulanate (AUGMENTIN) 875-125 MG per tablet 1 tablet  1 tablet Oral Daily Geradine Girt, DO   1 tablet at 01/05/15 1059  . antiseptic oral rinse (CPC / CETYLPYRIDINIUM CHLORIDE 0.05%) solution 7 mL  7 mL Mouth Rinse BID Wilhelmina Mcardle, MD   7  mL at 01/04/15 1000  . atenolol (TENORMIN) tablet 12.5 mg  12.5 mg Oral Daily Jake Church Masters, RPH   12.5 mg at 01/04/15 4401  . clonazePAM (KLONOPIN) tablet 0.5 mg  0.5 mg Oral Daily PRN Jake Church Masters, [facility]      . feeding supplement (NEPRO CARB STEADY) liquid 237 mL  237 mL Oral PRN Rexene Agent, MD      . haloperidol (HALDOL) tablet 2 mg  2 mg Oral Q6H PRN Geradine Girt, DO      . heparin injection  5,000 Units  5,000 Units Subcutaneous 3 times per day Otho Bellows, MD   5,000 Units at 01/04/15 2302  . insulin aspart (novoLOG) injection 0-15 Units  0-15 Units Subcutaneous TID WC Geradine Girt, DO   2 Units at 01/05/15 [phone]  . insulin aspart (novoLOG) injection 0-5 Units  0-5 Units Subcutaneous QHS Geradine Girt, DO   2 Units at 01/03/15 1713  . insulin aspart (novoLOG) injection 3 Units  3 Units Subcutaneous BID WC Geradine Girt, DO   3 Units at 01/05/15 0859  . insulin glargine (LANTUS) injection 15 Units  15 Units Subcutaneous QAC supper Geradine Girt, DO   15 Units at 01/04/15 2301  . lamoTRIgine (LAMICTAL) tablet 200 mg  200 mg Oral Daily Alexa Sherral Hammers, MD   200 mg at 01/05/15 1059  . levothyroxine (SYNTHROID, LEVOTHROID) tablet 150 mcg  150 mcg Oral QAC breakfast Alexa Sherral Hammers, MD   150 mcg at 01/04/15 0855  . ondansetron (ZOFRAN) injection 4 mg  4 mg Intravenous Q6H PRN Donita Brooks, NP   4 mg at 01/02/15 1214  . potassium chloride SA (K-DUR,KLOR-CON) CR tablet 40 mEq  40 mEq Oral Once Alexa Sherral Hammers, MD   40 mEq at 01/01/15 1223  . sodium chloride 0.9 % injection 3 mL  3 mL Intravenous PRN Geradine Girt, DO      . sodium chloride 0.9 % injection 3 mL  3 mL Intravenous Q12H Geradine Girt, DO   3 mL at 01/04/15 2302  . zolpidem (AMBIEN) tablet 5 mg  5 mg Oral QHS PRN Geradine Girt, DO        Musculoskeletal: Strength & Muscle Tone: decreased Gait & Station: normal, unable to stand Patient leans: N/A  Psychiatric Specialty Exam: Physical Exam as per history and physical   ROS depression, suicidal thoughts, isolated, withdrawn and status post suicidal attempt, generalized weakness, pain on her right eye and guarded.   Blood pressure 135/69, pulse 57, temperature 98.4 F (36.9 C), temperature source Oral, resp. rate 18, height $RemoveBe'5\' 1"'TlJbfviLG$  (1.549 m), weight 75 kg (165 lb 5.5 oz), SpO2 100 %.Body mass index is 31.26 kg/(m^2).  General Appearance: Casual and dress in  hospital gown  Eye Contact::  Good  Speech:  Clear and Coherent and Slow  Volume:  Normal  Mood:  Euthymic  Affect:  Appropriate  Thought Process:  Coherent and Goal Directed  Orientation:  Full (Time, Place, and Person)  Thought Content:  WDL  Suicidal Thoughts:  No  Homicidal Thoughts:  No  Memory:  Immediate;   Good Recent;   Good Remote;   Good  Judgement:  Fair  Insight:  Fair  Psychomotor Activity:  Normal  Concentration:  Fair  Recall:  Weed of Knowledge:Good  Language: Good  Akathisia:  Negative  Handed:  Right  AIMS (if indicated):     Assets:  Communication Skills Desire for Improvement Financial Resources/Insurance Housing Intimacy Leisure Time Physical Health Resilience Social Support Talents/Skills Transportation  ADL's:  Impaired  Cognition: WNL  Sleep:   fair   Medical Decision Making: Established Problem, Stable/Improving (1), Review or order clinical lab tests (1), Review of Medication Regimen & Side Effects (2) and Review of New Medication or Change in Dosage (2)  Treatment Plan Summary: Daily contact with patient to assess and evaluate symptoms and progress in treatment and Medication management  Plan:  Continue Lamotrigine 200 mg daily bipolar mood swings and Synthroid 150 micrograms for hypothyroidism Patient is able to contract for safety, No evidence of imminent risk to self or others at present.   Patient does not meet criteria for psychiatric inpatient admission.  Appreciate psychiatric consultation and sign off today Please contact [phone] or [phone] if needs further assistance  Disposition: Patient will be referred to the outpatient psychiatric services. Please schedule patient to follow up with Dr. Casimiro Needle at [facility] upon discharge/when medically cleared.   Durward Parcel , MD  01/05/2015 12:12 PM

## 2015-01-08 ENCOUNTER — Emergency Department (HOSPITAL_COMMUNITY): Payer: Medicare Other

## 2015-01-08 ENCOUNTER — Inpatient Hospital Stay (HOSPITAL_COMMUNITY)
Admission: EM | Admit: 2015-01-08 | Discharge: 2015-01-11 | DRG: 392 | Disposition: A | Discharge status: Hospice | Payer: Medicare Other | Attending: Internal Medicine | Admitting: Internal Medicine

## 2015-01-08 ENCOUNTER — Encounter (HOSPITAL_COMMUNITY): Payer: Self-pay | Admitting: Emergency Medicine

## 2015-01-08 DIAGNOSIS — R1013 Epigastric pain: Secondary | ICD-10-CM | POA: Diagnosis not present

## 2015-01-08 DIAGNOSIS — Z794 Long term (current) use of insulin: Secondary | ICD-10-CM

## 2015-01-08 DIAGNOSIS — F329 Major depressive disorder, single episode, unspecified: Secondary | ICD-10-CM | POA: Diagnosis present

## 2015-01-08 DIAGNOSIS — R112 Nausea with vomiting, unspecified: Secondary | ICD-10-CM | POA: Diagnosis not present

## 2015-01-08 DIAGNOSIS — C787 Secondary malignant neoplasm of liver and intrahepatic bile duct: Secondary | ICD-10-CM | POA: Diagnosis present

## 2015-01-08 DIAGNOSIS — Z85831 Personal history of malignant neoplasm of soft tissue: Secondary | ICD-10-CM | POA: Diagnosis not present

## 2015-01-08 DIAGNOSIS — K222 Esophageal obstruction: Secondary | ICD-10-CM | POA: Diagnosis present

## 2015-01-08 DIAGNOSIS — D72829 Elevated white blood cell count, unspecified: Secondary | ICD-10-CM | POA: Diagnosis present

## 2015-01-08 DIAGNOSIS — Z96652 Presence of left artificial knee joint: Secondary | ICD-10-CM | POA: Diagnosis present

## 2015-01-08 DIAGNOSIS — Z888 Allergy status to other drugs, medicaments and biological substances status: Secondary | ICD-10-CM

## 2015-01-08 DIAGNOSIS — Z79899 Other long term (current) drug therapy: Secondary | ICD-10-CM

## 2015-01-08 DIAGNOSIS — N183 Chronic kidney disease, stage 3 unspecified: Secondary | ICD-10-CM | POA: Diagnosis present

## 2015-01-08 DIAGNOSIS — Z853 Personal history of malignant neoplasm of breast: Secondary | ICD-10-CM

## 2015-01-08 DIAGNOSIS — I10 Essential (primary) hypertension: Secondary | ICD-10-CM | POA: Diagnosis not present

## 2015-01-08 DIAGNOSIS — Z66 Do not resuscitate: Secondary | ICD-10-CM | POA: Diagnosis present

## 2015-01-08 DIAGNOSIS — K209 Esophagitis, unspecified: Secondary | ICD-10-CM | POA: Diagnosis present

## 2015-01-08 DIAGNOSIS — C259 Malignant neoplasm of pancreas, unspecified: Secondary | ICD-10-CM | POA: Diagnosis present

## 2015-01-08 DIAGNOSIS — R0789 Other chest pain: Secondary | ICD-10-CM

## 2015-01-08 DIAGNOSIS — I129 Hypertensive chronic kidney disease with stage 1 through stage 4 chronic kidney disease, or unspecified chronic kidney disease: Secondary | ICD-10-CM | POA: Diagnosis present

## 2015-01-08 DIAGNOSIS — Z515 Encounter for palliative care: Secondary | ICD-10-CM | POA: Insufficient documentation

## 2015-01-08 DIAGNOSIS — E1140 Type 2 diabetes mellitus with diabetic neuropathy, unspecified: Secondary | ICD-10-CM | POA: Diagnosis present

## 2015-01-08 DIAGNOSIS — E039 Hypothyroidism, unspecified: Secondary | ICD-10-CM | POA: Diagnosis present

## 2015-01-08 DIAGNOSIS — R079 Chest pain, unspecified: Secondary | ICD-10-CM | POA: Diagnosis present

## 2015-01-08 DIAGNOSIS — N179 Acute kidney failure, unspecified: Secondary | ICD-10-CM | POA: Diagnosis present

## 2015-01-08 DIAGNOSIS — R1110 Vomiting, unspecified: Secondary | ICD-10-CM

## 2015-01-08 LAB — COMPREHENSIVE METABOLIC PANEL
ALBUMIN: 3.2 g/dL — AB (ref 3.5–5.0)
ALT: 27 U/L (ref 14–54)
AST: 24 U/L (ref 15–41)
Alkaline Phosphatase: 66 U/L (ref 38–126)
Anion gap: 13 (ref 5–15)
BILIRUBIN TOTAL: 0.5 mg/dL (ref 0.3–1.2)
BUN: 38 mg/dL — ABNORMAL HIGH (ref 6–20)
CO2: 27 mmol/L (ref 22–32)
CREATININE: 2.83 mg/dL — AB (ref 0.44–1.00)
Calcium: 8.1 mg/dL — ABNORMAL LOW (ref 8.9–10.3)
Chloride: 104 mmol/L (ref 101–111)
GFR, EST AFRICAN AMERICAN: 18 mL/min — AB (ref >60)
GFR, EST NON AFRICAN AMERICAN: 16 mL/min — AB (ref >60)
Glucose, Bld: 270 mg/dL — ABNORMAL HIGH (ref 70–99)
POTASSIUM: 4.7 mmol/L (ref 3.5–5.1)
Sodium: 144 mmol/L (ref 135–145)
Total Protein: 6 g/dL — ABNORMAL LOW (ref 6.5–8.1)

## 2015-01-08 LAB — CBC WITH DIFFERENTIAL/PLATELET
BASOS ABS: 0 10*3/uL (ref 0.0–0.1)
BASOS PCT: 0 % (ref 0–1)
Eosinophils Absolute: 0.2 10*3/uL (ref 0.0–0.7)
Eosinophils Relative: 1 % (ref 0–5)
HEMATOCRIT: 29.6 % — AB (ref 36.0–46.0)
HEMOGLOBIN: 9.3 g/dL — AB (ref 12.0–15.0)
Lymphocytes Relative: 12 % (ref 12–46)
Lymphs Abs: 2.4 10*3/uL (ref 0.7–4.0)
MCH: 31.3 pg (ref 26.0–34.0)
MCHC: 31.4 g/dL (ref 30.0–36.0)
MCV: 99.7 fL (ref 78.0–100.0)
Monocytes Absolute: 1.4 10*3/uL — ABNORMAL HIGH (ref 0.1–1.0)
Monocytes Relative: 7 % (ref 3–12)
NEUTROS PCT: 80 % — AB (ref 43–77)
Neutro Abs: 15.8 10*3/uL — ABNORMAL HIGH (ref 1.7–7.7)
Platelets: 327 10*3/uL (ref 150–400)
RBC: 2.97 MIL/uL — ABNORMAL LOW (ref 3.87–5.11)
RDW: 15.6 % — AB (ref 11.5–15.5)
WBC: 19.9 10*3/uL — ABNORMAL HIGH (ref 4.0–10.5)

## 2015-01-08 LAB — TROPONIN I: Troponin I: 0.03 ng/mL (ref <0.031)

## 2015-01-08 LAB — LIPASE, BLOOD: LIPASE: 24 U/L (ref 22–51)

## 2015-01-08 LAB — POC OCCULT BLOOD, ED: Fecal Occult Bld: NEGATIVE

## 2015-01-08 MED ORDER — GABAPENTIN 300 MG PO CAPS
300.0000 mg | ORAL_CAPSULE | Freq: Four times a day (QID) | ORAL | Status: DC
  Administered 2015-01-09 – 2015-01-10 (×4): 300 mg via ORAL
  Filled 2015-01-08 (×8): qty 1

## 2015-01-08 MED ORDER — INSULIN GLARGINE 100 UNIT/ML ~~LOC~~ SOLN
15.0000 [IU] | Freq: Every day | SUBCUTANEOUS | Status: DC
  Administered 2015-01-09 – 2015-01-10 (×2): 15 [IU] via SUBCUTANEOUS
  Filled 2015-01-08 (×4): qty 0.15

## 2015-01-08 MED ORDER — SODIUM CHLORIDE 0.9 % IV SOLN
1.5000 g | Freq: Once | INTRAVENOUS | Status: DC
  Filled 2015-01-08: qty 1.5

## 2015-01-08 MED ORDER — AMLODIPINE BESYLATE 10 MG PO TABS
10.0000 mg | ORAL_TABLET | Freq: Every day | ORAL | Status: DC
  Filled 2015-01-08: qty 1

## 2015-01-08 MED ORDER — CLONAZEPAM 0.5 MG PO TABS: 0.5000 mg | ORAL_TABLET | Freq: Three times a day (TID) | ORAL | Status: DC | PRN

## 2015-01-08 MED ORDER — SODIUM CHLORIDE 0.9 % IV SOLN
INTRAVENOUS | Status: DC
  Administered 2015-01-08 – 2015-01-10 (×4): via INTRAVENOUS

## 2015-01-08 MED ORDER — TAMOXIFEN CITRATE 10 MG PO TABS
20.0000 mg | ORAL_TABLET | Freq: Every day | ORAL | Status: DC
  Administered 2015-01-09 – 2015-01-11 (×3): 20 mg via ORAL
  Filled 2015-01-08 (×3): qty 2

## 2015-01-08 MED ORDER — LAMOTRIGINE 200 MG PO TABS
200.0000 mg | ORAL_TABLET | Freq: Every day | ORAL | Status: DC
  Administered 2015-01-09 – 2015-01-11 (×3): 200 mg via ORAL
  Filled 2015-01-08 (×3): qty 1

## 2015-01-08 MED ORDER — LORATADINE 10 MG PO TABS
10.0000 mg | ORAL_TABLET | Freq: Every day | ORAL | Status: DC
  Administered 2015-01-09: 10 mg via ORAL
  Filled 2015-01-08: qty 1

## 2015-01-08 MED ORDER — PROMETHAZINE HCL 25 MG/ML IJ SOLN: 12.5000 mg | Freq: Four times a day (QID) | INTRAMUSCULAR | Status: DC | PRN

## 2015-01-08 MED ORDER — AMOXICILLIN-POT CLAVULANATE 875-125 MG PO TABS
1.0000 | ORAL_TABLET | Freq: Every day | ORAL | Status: DC
  Filled 2015-01-08: qty 1

## 2015-01-08 MED ORDER — LEVOTHYROXINE SODIUM 150 MCG PO TABS
150.0000 ug | ORAL_TABLET | Freq: Every day | ORAL | Status: DC
  Filled 2015-01-08 (×2): qty 1

## 2015-01-08 MED ORDER — PRAVASTATIN SODIUM 80 MG PO TABS
80.0000 mg | ORAL_TABLET | Freq: Every day | ORAL | Status: DC
  Filled 2015-01-08: qty 1

## 2015-01-08 MED ORDER — ONDANSETRON HCL 4 MG/2ML IJ SOLN
4.0000 mg | Freq: Four times a day (QID) | INTRAMUSCULAR | Status: DC | PRN
  Filled 2015-01-08: qty 2

## 2015-01-08 MED ORDER — ATENOLOL 12.5 MG HALF TABLET
12.5000 mg | ORAL_TABLET | Freq: Every day | ORAL | Status: DC
  Filled 2015-01-08: qty 1

## 2015-01-08 MED ORDER — INSULIN ASPART 100 UNIT/ML ~~LOC~~ SOLN
0.0000 [IU] | SUBCUTANEOUS | Status: DC
  Administered 2015-01-09: 7 [IU] via SUBCUTANEOUS
  Administered 2015-01-09: 2 [IU] via SUBCUTANEOUS
  Administered 2015-01-10: 3 [IU] via SUBCUTANEOUS
  Administered 2015-01-10 (×2): 2 [IU] via SUBCUTANEOUS
  Administered 2015-01-10: 7 [IU] via SUBCUTANEOUS
  Administered 2015-01-11: 1 [IU] via SUBCUTANEOUS
  Administered 2015-01-11: 3 [IU] via SUBCUTANEOUS

## 2015-01-08 MED ORDER — ZOLPIDEM TARTRATE 5 MG PO TABS
5.0000 mg | ORAL_TABLET | Freq: Every evening | ORAL | Status: DC | PRN
  Administered 2015-01-09 – 2015-01-11 (×3): 5 mg via ORAL
  Filled 2015-01-08 (×3): qty 1

## 2015-01-08 NOTE — ED Notes (Signed)
MD at the bedside attempting Hemoccult and exam.

## 2015-01-08 NOTE — ED Notes (Signed)
Pt. reports multiple emesis onset yesterday , denies diarrhea , no fever or chills. Pt. stated " pinkish colored " vomitus today .

## 2015-01-08 NOTE — ED Notes (Signed)
MD Wofford at the bedside.

## 2015-01-08 NOTE — H&P (Signed)
[facility] History and Physical  Kristi Fuentes DOB: Dec 09, 1945 DOA: 01/08/2015  Referring physician: EDP PCP: Dwan Bolt, MD   Chief Complaint: N/V, epigastric and chest pain   HPI: Kristi Fuentes is a 69 y.o. female with h/o metastatic pancreatic cancer with liver mets, currently not undergoing chemotherapy, mets have been enlarging between January and April MRI scans.  Patient was just admitted last week for salicylate overdose and BPD depression after finding out about the results.  She did require intubation during that admission, but after extubation had no difficulty with PO intake until 2 days ago.  Patient presents to ED with 2 day history of N/V and epigastric and chest pain.  Symptoms occur immediately after eating a meal.  Vomit was initially food stuff, and has now become tinged with red.  Review of Systems: Systems reviewed.  As above, otherwise negative  Past Medical History  Diagnosis Date  . Diabetes mellitus   . Hypertension   . Chronic kidney disease   . Kidney stones   . Allergy   . Pancreatic cancer     Chemotherapy  . Primary pancreatic neuroendocrine tumor 2013  . Blood transfusion without reported diagnosis   . Heart murmur   . Thyroid disease 01/2002    2 radiaoactive iodine for cancer  . PONV (postoperative nausea and vomiting)   . Hypothyroidism     had thyroid removed 2002  . Cyst (solitary) of breast   . Simple cyst of kidney     unsure of which side maybe on both kidney's  . Incisional hernia   . Anemia   . Depression     takes Lamictal daily  . Status post chemotherapy     pancreatic histrory  . Kidney failure     During chemotherapy for pancreatic cancer  . Breast cancer   . Sarcoma     Right buttock  . Hernia     Abdominal   Past Surgical History  Procedure Laterality Date  . Buttock sarcoma  2000  . Thyroidectomy  94/85/4627    Follicular Palliary carcinoma  . Abdominal hysterectomy    . Total knee  arthroplasty      left  . Lipoma excision  04/20/2011    left arm  . Cholecystectomy    . Spleenectomy    . Breast surgery    . Urethral stricture dilatation  [id number]  . Pressure ulcer buttock  2005    from position during knee surgery   . Breast lumpectomy with needle localization and axillary sentinel lymph node bx Right 05/30/2013    Procedure: BREAST LUMPECTOMY WITH NEEDLE LOCALIZATION AND AXILLARY SENTINEL LYMPH NODE BX;  Surgeon: Harl Bowie, MD;  Location: [facility];  Service: General;  Laterality: Right;   Social History:  reports that she has never smoked. She has never used smokeless tobacco. She reports that she does not drink alcohol or use illicit drugs.  Allergies  Allergen Reactions  . Metoclopramide Hcl     Tardive dyskinesia-type symptoms  . Ramipril Cough    Family History  Problem Relation Age of Onset  . Diabetes Father   . Kidney disease Father   . Cancer Father     colon  . Cancer Maternal Grandmother     breast     Prior to Admission medications   Medication Sig Start Date End Date Taking? Authorizing Provider  amLODipine (NORVASC) 10 MG tablet Take 10 mg by mouth daily.   Yes Historical Provider,  MD  amoxicillin-clavulanate (AUGMENTIN) 875-125 MG per tablet Take 1 tablet by mouth daily. 01/05/15  Yes Delfina Redwood, MD  atenolol (TENORMIN) 25 MG tablet Take 12.5 mg by mouth daily.    Yes Historical Provider, MD  calcium carbonate (TUMS EX) 750 MG chewable tablet Chew 1 tablet by mouth 2 (two) times daily.   Yes Historical Provider, MD  clonazePAM (KLONOPIN) 0.5 MG tablet Take 0.5 mg by mouth as needed for anxiety.   Yes Historical Provider, MD  gabapentin (NEURONTIN) 300 MG capsule Take 300 mg by mouth 4 (four) times daily.   Yes Historical Provider, MD  insulin aspart (NOVOLOG) 100 UNIT/ML injection Inject 3 Units into the skin 2 (two) times daily. Takes before breakfast and lunch.   Yes Historical Provider, MD  insulin glargine (LANTUS) 100  UNIT/ML injection Inject 22 Units into the skin daily before supper.   Yes Historical Provider, MD  lamoTRIgine (LAMICTAL) 200 MG tablet Take 200 mg by mouth daily.    Yes Historical Provider, MD  levothyroxine (SYNTHROID, LEVOTHROID) 150 MCG tablet Take 150 mcg by mouth daily.     Yes Historical Provider, MD  loratadine (CLARITIN) 10 MG tablet Take 10 mg by mouth daily.     Yes Historical Provider, MD  pravastatin (PRAVACHOL) 80 MG tablet Take 80 mg by mouth daily.     Yes Historical Provider, MD  tamoxifen (NOLVADEX) 20 MG tablet Take 1 tablet (20 mg total) by mouth daily. 07/09/14  Yes Volanda Napoleon, MD  zolpidem (AMBIEN) 5 MG tablet Take 1 tablet (5 mg total) by mouth at bedtime as needed for sleep. 01/05/15  Yes Delfina Redwood, MD   Physical Exam: Filed Vitals:   01/08/15 2145  BP: 123/58  Pulse: 64  Temp:   Resp: 18    BP 123/58 mmHg  Pulse 64  Temp(Src) 99.1 F (37.3 C) (Oral)  Resp 18  Ht 5\' 2"  (1.575 m)  Wt 71.396 kg (157 lb 6.4 oz)  BMI 28.78 kg/m2  SpO2 97%  General Appearance:    Alert, oriented, no distress, appears stated age  Head:    Normocephalic, atraumatic  Eyes:    PERRL, EOMI, sclera non-icteric        Nose:   Nares without drainage or epistaxis. Mucosa, turbinates normal  Throat:   Moist mucous membranes. Oropharynx without erythema or exudate.  Neck:   Supple. No carotid bruits.  No thyromegaly.  No lymphadenopathy.   Back:     No CVA tenderness, no spinal tenderness  Lungs:     Clear to auscultation bilaterally, without wheezes, rhonchi or rales  Chest wall:    No tenderness to palpitation  Heart:    Regular rate and rhythm without murmurs, gallops, rubs  Abdomen:     Soft, non-tender, nondistended, normal bowel sounds, no organomegaly  Genitalia:    deferred  Rectal:    deferred  Extremities:   No clubbing, cyanosis or edema.  Pulses:   2+ and symmetric all extremities  Skin:   Skin color, texture, turgor normal, no rashes or lesions  Lymph  nodes:   Cervical, supraclavicular, and axillary nodes normal  Neurologic:   CNII-XII intact. Normal strength, sensation and reflexes      throughout    Labs on Admission:  Basic Metabolic Panel:  Recent Labs Lab 01/02/15 0708 01/04/15 0642 01/08/15 1952  NA 145 143 144  K 3.3* 3.4* 4.7  CL 104 106 104  CO2 24 26 27   GLUCOSE  124* 156* 270*  BUN 46* 42* 38*  CREATININE 2.96* 2.79* 2.83*  CALCIUM 6.8* 6.6* 8.1*   Liver Function Tests:  Recent Labs Lab 01/08/15 1952  AST 24  ALT 27  ALKPHOS 66  BILITOT 0.5  PROT 6.0*  ALBUMIN 3.2*    Recent Labs Lab 01/08/15 2000  LIPASE 24   No results for input(s): AMMONIA in the last 168 hours. CBC:  Recent Labs Lab 01/02/15 0708 01/04/15 0642 01/08/15 1952  WBC 14.3* 10.1 19.9*  NEUTROABS  --   --  15.8*  HGB 8.5* 8.9* 9.3*  HCT 26.4* 27.2* 29.6*  MCV 97.4 95.4 99.7  PLT 249 284 327   Cardiac Enzymes:  Recent Labs Lab 01/08/15 2000  TROPONINI 0.03    BNP (last 3 results) No results for input(s): PROBNP in the last 8760 hours. CBG:  Recent Labs Lab 01/04/15 1403 01/04/15 1623 01/04/15 2136 01/05/15 0803 01/05/15 1157  GLUCAP 208* 191* 187* 122* 91    Radiological Exams on Admission: Dg Chest 2 View  01/08/2015   CLINICAL DATA:  Chest pain, shortness of breath  EXAM: CHEST  2 VIEW  COMPARISON:  01/01/2015  FINDINGS: Small right pleural effusion. No left pleural effusion. No focal consolidation or pneumothorax. Stable heart and mediastinum. Calcified mediastinal lymph node. No acute osseous abnormality.  IMPRESSION: Small right pleural effusion.   Electronically Signed   By: Kathreen Devoid   On: 01/08/2015 20:56    EKG: Independently reviewed.  Assessment/Plan Active Problems:   HTN (hypertension)   Chest pain   Nausea & vomiting   Leukocytosis   Epigastric pain   Acute renal failure superimposed on stage 3 chronic kidney disease   1. N/V, epigastric and chest pain -  1. Highly concerning for  complication from her known and extensive pancreatic cancer and metastatic disease 2. CT abd/pelvis with PO contrast only (no IV due to patients kidney function) 3. zofran prn nausea 4. Further work up or plan pending results: ? Tumor caused obstruction, etc. 2. Leukocytosis - most likely reactive to #1 above 1. Trend daily CBC 2. Continuing the Augmentin she was discharged on from last admit for aspiration PNA. 3. AKI on CKD stage 3 - likely pre-renal due to dehydration associated with #1 1. IVF 2. Repeat BMP in AM 4. HTN - continue home meds 5. DM2 - reduce lantus to 15 units daily, add low dose SSI q4h    Code Status: DNR - discussed with patient who wishes to be DNR, she dosent want to be a full hospice patient at this time she says.  May want to keep pushing for palliative care involvement with this patient but she dosent want this at this time.  Family Communication: No family in room Disposition Plan: Admit to inpatient   Time spent: 70 min  Shenelle Klas M. [facility] Pager [phone]  If 7AM-7PM, please contact the day team taking care of the patient [email or web address] Password [email or web address] 01/08/2015, 11:23 PM

## 2015-01-08 NOTE — ED Notes (Signed)
Patient transported to X-ray 

## 2015-01-08 NOTE — ED Notes (Signed)
PT returned to room and re-attached to monitor by this RN

## 2015-01-09 ENCOUNTER — Encounter (HOSPITAL_COMMUNITY)
Admission: EM | Disposition: A | Discharge status: Hospice | Payer: Self-pay | Source: Home / Self Care | Attending: Internal Medicine

## 2015-01-09 ENCOUNTER — Encounter (HOSPITAL_COMMUNITY): Payer: Self-pay

## 2015-01-09 ENCOUNTER — Inpatient Hospital Stay (HOSPITAL_COMMUNITY): Payer: Medicare Other

## 2015-01-09 DIAGNOSIS — R112 Nausea with vomiting, unspecified: Secondary | ICD-10-CM

## 2015-01-09 HISTORY — PX: ESOPHAGOGASTRODUODENOSCOPY: SHX5428

## 2015-01-09 LAB — BASIC METABOLIC PANEL
Anion gap: 12 (ref 5–15)
BUN: 36 mg/dL — AB (ref 6–20)
CHLORIDE: 104 mmol/L (ref 101–111)
CO2: 26 mmol/L (ref 22–32)
Calcium: 7.7 mg/dL — ABNORMAL LOW (ref 8.9–10.3)
Creatinine, Ser: 2.76 mg/dL — ABNORMAL HIGH (ref 0.44–1.00)
GFR calc Af Amer: 19 mL/min — ABNORMAL LOW (ref >60)
GFR calc non Af Amer: 16 mL/min — ABNORMAL LOW (ref >60)
GLUCOSE: 371 mg/dL — AB (ref 70–99)
POTASSIUM: 5 mmol/L (ref 3.5–5.1)
Sodium: 142 mmol/L (ref 135–145)

## 2015-01-09 LAB — CBC
HEMATOCRIT: 25.3 % — AB (ref 36.0–46.0)
HEMOGLOBIN: 8.3 g/dL — AB (ref 12.0–15.0)
MCH: 32.7 pg (ref 26.0–34.0)
MCHC: 32.8 g/dL (ref 30.0–36.0)
MCV: 99.6 fL (ref 78.0–100.0)
Platelets: 300 10*3/uL (ref 150–400)
RBC: 2.54 MIL/uL — ABNORMAL LOW (ref 3.87–5.11)
RDW: 15.6 % — ABNORMAL HIGH (ref 11.5–15.5)
WBC: 18.3 10*3/uL — AB (ref 4.0–10.5)

## 2015-01-09 LAB — GLUCOSE, CAPILLARY
GLUCOSE-CAPILLARY: 340 mg/dL — AB (ref 70–99)
GLUCOSE-CAPILLARY: 132 mg/dL — AB (ref 70–99)
GLUCOSE-CAPILLARY: 97 mg/dL (ref 70–99)
Glucose-Capillary: 319 mg/dL — ABNORMAL HIGH (ref 70–99)
Glucose-Capillary: 93 mg/dL (ref 70–99)
Glucose-Capillary: 109 mg/dL — ABNORMAL HIGH (ref 70–99)
Glucose-Capillary: 171 mg/dL — ABNORMAL HIGH (ref 70–99)

## 2015-01-09 SURGERY — EGD (ESOPHAGOGASTRODUODENOSCOPY)
Anesthesia: Moderate Sedation

## 2015-01-09 MED ORDER — ONDANSETRON HCL 4 MG/2ML IJ SOLN
4.0000 mg | Freq: Two times a day (BID) | INTRAMUSCULAR | Status: DC
  Administered 2015-01-09 – 2015-01-10 (×3): 4 mg via INTRAVENOUS
  Filled 2015-01-09 (×3): qty 2

## 2015-01-09 MED ORDER — PANTOPRAZOLE SODIUM 40 MG IV SOLR
40.0000 mg | Freq: Two times a day (BID) | INTRAVENOUS | Status: DC
  Administered 2015-01-09 – 2015-01-11 (×4): 40 mg via INTRAVENOUS
  Filled 2015-01-09 (×5): qty 40

## 2015-01-09 MED ORDER — FENTANYL CITRATE (PF) 100 MCG/2ML IJ SOLN
INTRAMUSCULAR | Status: DC | PRN
  Administered 2015-01-09 (×2): 25 ug via INTRAVENOUS

## 2015-01-09 MED ORDER — AMPICILLIN-SULBACTAM SODIUM 1.5 (1-0.5) G IJ SOLR
1.5000 g | Freq: Two times a day (BID) | INTRAMUSCULAR | Status: DC
  Administered 2015-01-09 (×2): 1.5 g via INTRAVENOUS
  Filled 2015-01-09 (×6): qty 1.5

## 2015-01-09 MED ORDER — LEVOTHYROXINE SODIUM 100 MCG IV SOLR
75.0000 ug | Freq: Every day | INTRAVENOUS | Status: DC
  Administered 2015-01-09 – 2015-01-10 (×2): 75 ug via INTRAVENOUS
  Filled 2015-01-09 (×2): qty 5

## 2015-01-09 MED ORDER — FENTANYL CITRATE (PF) 100 MCG/2ML IJ SOLN
INTRAMUSCULAR | Status: AC
  Filled 2015-01-09: qty 2

## 2015-01-09 MED ORDER — METOPROLOL TARTRATE 1 MG/ML IV SOLN
5.0000 mg | Freq: Four times a day (QID) | INTRAVENOUS | Status: DC
  Filled 2015-01-09 (×5): qty 5

## 2015-01-09 MED ORDER — DIPHENHYDRAMINE HCL 50 MG/ML IJ SOLN
INTRAMUSCULAR | Status: AC
  Filled 2015-01-09: qty 1

## 2015-01-09 MED ORDER — MIDAZOLAM HCL 5 MG/ML IJ SOLN
INTRAMUSCULAR | Status: AC
  Filled 2015-01-09: qty 2

## 2015-01-09 MED ORDER — HEPARIN SODIUM (PORCINE) 5000 UNIT/ML IJ SOLN
5000.0000 [IU] | Freq: Three times a day (TID) | INTRAMUSCULAR | Status: DC
  Administered 2015-01-09 – 2015-01-11 (×7): 5000 [IU] via SUBCUTANEOUS
  Filled 2015-01-09 (×7): qty 1

## 2015-01-09 MED ORDER — MIDAZOLAM HCL 10 MG/2ML IJ SOLN
INTRAMUSCULAR | Status: DC | PRN
Start: 2015-01-09 — End: 2015-01-09
  Administered 2015-01-09 (×2): 2 mg via INTRAVENOUS

## 2015-01-09 MED ORDER — BUTAMBEN-TETRACAINE-BENZOCAINE 2-2-14 % EX AERO
INHALATION_SPRAY | CUTANEOUS | Status: DC | PRN
  Administered 2015-01-09: 2 via TOPICAL

## 2015-01-09 NOTE — Consult Note (Signed)
Consultation  Referring Provider:  [facility]    Primary Care Physician:  Dwan Bolt, MD Primary Gastroenterologist:  Dr. Earlean Shawl. Reason for Consultation:   Gastric outlet obstruction           HPI:   TIARIA BIBY is a 69 y.o. 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 Amalia Hailey). I have reviewed latest office note from Dr. Tia Masker in [facility]. Patient underwent a subtotal pancreatectomy to include including a hepatic ligament lymph node. At time of surgery she also had a cholecystectomy, and splenectomy.  Patient started on on adjuvant therapy with Gemcitabine but developed Gemcitabine induced nephropathy as well as myelosuppression, despite dose reduction.She completed about 1/2 of therapy.  Patient eventually developed liver lesions and underwent biopsy which was compatible with adeno. A repeat MRI 3/23 revealed that liver lesions had significantly increased in size. Her CA19-9 had risen rapidly. Repeat biopsy showed recurrence of pancreatic adenocarcinoma. Patient wasn't interested in pursuing further chemo.   Patient presented to ED with vomiting yesterday. Noncontrast CTscan reveals gastric outlet obstruction.    Past Medical History  Diagnosis Date  . Diabetes mellitus   . Hypertension   . Chronic kidney disease   . Kidney stones   . Allergy   . Pancreatic cancer     Chemotherapy  . Primary pancreatic neuroendocrine tumor 2013  . Blood transfusion without reported diagnosis   . Heart murmur   . Thyroid disease 01/2002    2 radiaoactive iodine for cancer  . PONV (postoperative nausea and vomiting)   . Hypothyroidism     had thyroid removed 2002  . Cyst (solitary) of breast   . Simple cyst of kidney     unsure of which side maybe on both kidney's  . Incisional hernia   . Anemia   . Depression     takes Lamictal daily  . Status post chemotherapy     pancreatic histrory  .  Kidney failure     During chemotherapy for pancreatic cancer  . Breast cancer   . Sarcoma     Right buttock  . Hernia     Abdominal    Past Surgical History  Procedure Laterality Date  . Buttock sarcoma  2000  . Thyroidectomy  31/54/0086    Follicular Palliary carcinoma  . Abdominal hysterectomy    . Total knee arthroplasty      left  . Lipoma excision  04/20/2011    left arm  . Cholecystectomy    . Spleenectomy    . Breast surgery    . Urethral stricture dilatation  [id number]  . Pressure ulcer buttock  2005    from position during knee surgery   . Breast lumpectomy with needle localization and axillary sentinel lymph node bx Right 05/30/2013    Procedure: BREAST LUMPECTOMY WITH NEEDLE LOCALIZATION AND AXILLARY SENTINEL LYMPH NODE BX;  Surgeon: Harl Bowie, MD;  Location: [place];  Service: General;  Laterality: Right;    Family History  Problem Relation Age of Onset  . Diabetes Father   . Kidney disease Father   . Cancer Father     colon  . Cancer Maternal Grandmother     breast     History  Substance Use Topics  . Smoking status: Never Smoker   . Smokeless tobacco: Never Used     Comment: never used tobacco  . Alcohol Use: No    Prior to Admission  medications   Medication Sig Start Date End Date Taking? Authorizing Provider  amLODipine (NORVASC) 10 MG tablet Take 10 mg by mouth daily.   Yes Historical Provider, MD  amoxicillin-clavulanate (AUGMENTIN) 875-125 MG per tablet Take 1 tablet by mouth daily. 01/05/15  Yes Delfina Redwood, MD  atenolol (TENORMIN) 25 MG tablet Take 12.5 mg by mouth daily.    Yes Historical Provider, MD  calcium carbonate (TUMS EX) 750 MG chewable tablet Chew 1 tablet by mouth 2 (two) times daily.   Yes Historical Provider, MD  clonazePAM (KLONOPIN) 0.5 MG tablet Take 0.5 mg by mouth as needed for anxiety.   Yes Historical Provider, MD  gabapentin (NEURONTIN) 300 MG capsule Take 300 mg by mouth 4 (four) times daily.   Yes Historical  Provider, MD  insulin aspart (NOVOLOG) 100 UNIT/ML injection Inject 3 Units into the skin 2 (two) times daily. Takes before breakfast and lunch.   Yes Historical Provider, MD  insulin glargine (LANTUS) 100 UNIT/ML injection Inject 22 Units into the skin daily before supper.   Yes Historical Provider, MD  lamoTRIgine (LAMICTAL) 200 MG tablet Take 200 mg by mouth daily.    Yes Historical Provider, MD  levothyroxine (SYNTHROID, LEVOTHROID) 150 MCG tablet Take 150 mcg by mouth daily.     Yes Historical Provider, MD  loratadine (CLARITIN) 10 MG tablet Take 10 mg by mouth daily.     Yes Historical Provider, MD  pravastatin (PRAVACHOL) 80 MG tablet Take 80 mg by mouth daily.     Yes Historical Provider, MD  tamoxifen (NOLVADEX) 20 MG tablet Take 1 tablet (20 mg total) by mouth daily. 07/09/14  Yes Volanda Napoleon, MD  zolpidem (AMBIEN) 5 MG tablet Take 1 tablet (5 mg total) by mouth at bedtime as needed for sleep. 01/05/15  Yes Delfina Redwood, MD    Current Facility-Administered Medications  Medication Dose Route Frequency Provider Last Rate Last Dose  . 0.9 %  sodium chloride infusion   Intravenous Continuous Etta Quill, DO 125 mL/hr at 01/09/15 0941    . ampicillin-sulbactam (UNASYN) 1.5 g in sodium chloride 0.9 % 50 mL IVPB  1.5 g Intravenous Q12H Veronda P Bryk, RPH   1.5 g at 01/09/15 0940  . clonazePAM (KLONOPIN) tablet 0.5 mg  0.5 mg Oral TID PRN Etta Quill, DO      . gabapentin (NEURONTIN) capsule 300 mg  300 mg Oral QID Etta Quill, DO   300 mg at 01/09/15 1001  . insulin aspart (novoLOG) injection 0-9 Units  0-9 Units Subcutaneous 6 times per day Etta Quill, DO   7 Units at 01/09/15 0405  . insulin glargine (LANTUS) injection 15 Units  15 Units Subcutaneous QHS Etta Quill, DO   15 Units at 01/08/15 2245  . lamoTRIgine (LAMICTAL) tablet 200 mg  200 mg Oral Daily Etta Quill, DO   200 mg at 01/09/15 1000  . levothyroxine (SYNTHROID, LEVOTHROID) injection 75 mcg  75  mcg Intravenous Daily Debbe Odea, MD   75 mcg at 01/09/15 1001  . loratadine (CLARITIN) tablet 10 mg  10 mg Oral Daily Etta Quill, DO   10 mg at 01/09/15 1000  . metoprolol (LOPRESSOR) injection 5 mg  5 mg Intravenous 4 times per day Debbe Odea, MD   5 mg at 01/09/15 0730  . ondansetron (ZOFRAN) injection 4 mg  4 mg Intravenous Q6H PRN Etta Quill, DO      . promethazine (PHENERGAN) injection  12.5 mg  12.5 mg Intravenous Q6H PRN Etta Quill, DO      . tamoxifen (NOLVADEX) tablet 20 mg  20 mg Oral Daily Etta Quill, DO   20 mg at 01/09/15 1000  . zolpidem (AMBIEN) tablet 5 mg  5 mg Oral QHS PRN Etta Quill, DO   5 mg at 01/09/15 0206    Allergies as of 01/08/2015 - Review Complete 01/08/2015  Allergen Reaction Noted  . Metoclopramide hcl  10/16/2011  . Ramipril Cough 04/05/2011    Review of Systems:    All systems reviewed and negative except where noted in HPI.   Physical Exam:  Vital signs in last 24 hours: Temp:  [99.1 F (37.3 C)-100.2 F (37.9 C)] 100 F (37.8 C) (05/06 0358) Pulse Rate:  [59-80] 64 (05/06 0358) Resp:  [15-20] 18 (05/06 0358) BP: (115-137)/(46-89) 137/63 mmHg (05/06 0358) SpO2:  [95 %-100 %] 96 % (05/06 0358) Weight:  [155 lb 1.6 oz (70.353 kg)-157 lb 6.4 oz (71.396 kg)] 155 lb 1.6 oz (70.353 kg) (05/06 0051) Last BM Date: 01/08/15 General:   Pleasant white female in NAD Head:  Normocephalic and atraumatic. Eyes:   No icterus.   Conjunctiva pink. Ears:  Normal auditory acuity. Neck:  Supple; no masses felt Lungs:  Respirations even and unlabored. Lungs clear to auscultation bilaterally.   No wheezes, crackles, or rhonchi.  Heart:  Regular rate and rhythm;  murmur heard. Abdomen:  Soft, nondistended, nontender. A few bowel sounds. No appreciable masses Rectal:  Not performed.  Msk:  Symmetrical without gross deformities.  Extremities:  Without edema. Neurologic:  Alert and  oriented x4;  grossly normal neurologically. Skin:   Intact without significant lesions or rashes. Cervical Nodes:  No significant cervical adenopathy. Psych:  Alert and cooperative. Normal affect.  LAB RESULTS:  Recent Labs  01/08/15 1952 01/09/15 0334  WBC 19.9* 18.3*  HGB 9.3* 8.3*  HCT 29.6* 25.3*  PLT 327 300   BMET  Recent Labs  01/08/15 1952 01/09/15 0334  NA 144 142  K 4.7 5.0  CL 104 104  CO2 27 26  GLUCOSE 270* 371*  BUN 38* 36*  CREATININE 2.83* 2.76*  CALCIUM 8.1* 7.7*   LFT  Recent Labs  01/08/15 1952  PROT 6.0*  ALBUMIN 3.2*  AST 24  ALT 27  ALKPHOS 4  BILITOT 0.5   STUDIES: Ct Abdomen Pelvis Wo Contrast  01/09/2015   CLINICAL DATA:  Pancreatic carcinoma, metastatic to the liver. Worsening epigastric pain with nausea and vomiting.  EXAM: CT ABDOMEN AND PELVIS WITHOUT CONTRAST  TECHNIQUE: Multidetector CT imaging of the abdomen and pelvis was performed following the standard protocol without IV contrast.  COMPARISON:  None.  FINDINGS: There is marked distention of the gastric fundus and body. The antrum and pylorus are nondistended and mildly irregular. The abrupt transition to narrowed antrum from prominently distended fundus and body is concerning for the possibility of neoplastic restriction of the gastric antrum. There is a large volume of retained food in the stomach, in addition to the oral contrast. The duodenum appears grossly normal. Remainder of the small bowel is unremarkable. Colon is unremarkable. There is no extraluminal air.  Multiple hepatic masses are present, consistent with the described history of metastatic pancreas carcinoma. No bile duct dilatation is evident.  There is no ascites.  There are numerous small peripheral cysts about both kidneys. There is no hydronephrosis or ureteral dilatation. Urinary bladder appears unremarkable.  There is prior splenectomy,  cholecystectomy and hysterectomy.  There is a small right pleural effusion and a trace left pleural effusion. There is mild  dependent base airspace opacity in the right lung which may be atelectatic.  IMPRESSION: 1. Marked distention of the gastric fundus and body, raising the question of a gastric outlet obstruction in the antrum or pylorus. There is a large volume of retained food in the stomach. 2. Multiple hepatic masses consistent with the described history. 3. Small right pleural effusion. Trace left pleural effusion. Mild atelectatic -appearing right lung base opacities.   Electronically Signed   By: Andreas Newport M.D.   On: 01/09/2015 01:53   Dg Chest 2 View  01/08/2015   CLINICAL DATA:  Chest pain, shortness of breath  EXAM: CHEST  2 VIEW  COMPARISON:  01/01/2015  FINDINGS: Small right pleural effusion. No left pleural effusion. No focal consolidation or pneumothorax. Stable heart and mediastinum. Calcified mediastinal lymph node. No acute osseous abnormality.  IMPRESSION: Small right pleural effusion.   Electronically Signed   By: Kathreen Devoid   On: 01/08/2015 20:56    PREVIOUS ENDOSCOPIES:            At [facility]   Impression / Plan:    1. Pleasant 69 year old female with metastatic pancreatic cancer. Originally diagnosed in 2012 at [facility], she had neuroendocrine tumor in one pancreatic lesion and adeno in another lesion. She underwent a subtotal pancreatectomy followed by chemo.  Did okay until recently found to have recurrent cancer with liver mets.  Patient not interested in trying chemo again. Admitted yesterday with vomiting and gastric outlet obstruction. Patient wants all efforts to be palliative.     Suspect malignant obstruction which may or may not be amenable to stenting. If not then venting PEG may be an option. She is no longer vomiting nor distended so NGT not placed. Still may need one prior to an EGD to make sure stomach is empty.  2. History of breast cancer, she is 2 years out from treatment now  3. [facility] of colon cancer in father (in his 56s). Patient thinks she had a colonoscopy 5 years ago in  [place]. I am unable to determine who performed it on [facility] (no [facility] nor Dr. Benson Norway)  Thanks   LOS: 1 day   Tye Savoy  01/09/2015, 10:18 AM   ________________________________________________________________________  Velora Heckler GI MD note:  I personally examined the patient, reviewed the data and agree with the assessment and plan described above.  She has incomplete GOO (CT contrast passed into small bowel and her vomiting/distension has stopped).  I recommended we proceed with EGD today to help with diagnosis.  I suspect this is malignant obstruction given her known metastatic pancreatic cancer.  It is interesting that initial pancreatic tumor was neuroendocrine and mets were adenocarcinoma.  Perhaps she actually has gastric primary with mets to liver?  I discussed stents, decompressive PEGs.  She prefers to not do anything too invasive, permanent currently and would hope that modifying her diet will help.  I called her gastroenterologist's office, Dr. Earlean Shawl, however he is out of town this weekend and did not schedule dedicated coverage for his patients.  I am happy to help her now and she can follow up with him after this admission.   Owens Loffler, MD [facility] Gastroenterology Pager [phone]

## 2015-01-09 NOTE — Care Management (Signed)
Medicare Important Message given? Yes (If Response is "NO", the following Medicare IM given date fields will be blank) Date Medicare IM given: 01/09/15 Medicare IM given by: Ashleyanne Hemmingway 

## 2015-01-09 NOTE — Progress Notes (Addendum)
69yo female c/o N/V w/ epigastric and chest pain, emesis initially foods then had become tinged w/ blood, concern for aspiration, to begin IV ABX.  Will start Unasyn 1.5g IV Q12H for CrCl ~20 ml/min and monitor CBC and Cx.  Wynona Neat, PharmD, BCPS 01/09/2015 7:28 AM   ADDN: Pharmacy is consulted to transition patient to liquid Augmentin. With CrCl ~ 20 ml/min, will start Augmentin 500mg  PO q12h  Pharmacy will sign off.  Andrey Cota. Diona Foley, PharmD Clinical Pharmacist Pager [phone]

## 2015-01-09 NOTE — Op Note (Signed)
[facility] [place], [address]   ENDOSCOPY PROCEDURE REPORT  PATIENT: Kristi Fuentes, Kristi Fuentes  MR#: [id number] BIRTHDATE: Jan 19, 1946 , 66  yrs. old GENDER: female ENDOSCOPIST: Milus Banister, MD PROCEDURE DATE:  01/09/2015 PROCEDURE:  EGD w/ biopsy ASA CLASS:     Class III INDICATIONS:  n/vomiting distended stomach on admitting CT with suggestion of malignant obstruction (known metastatic adenocarcinoma to liver, presumed to have been related to pancreatic neuroendocrine removed 2-3 years ago). MEDICATIONS: Fentanyl 50 mcg IV and Versed 4 mg IV TOPICAL ANESTHETIC: none DESCRIPTION OF PROCEDURE: After the risks benefits and alternatives of the procedure were thoroughly explained, informed consent was obtained.  The [id number] ([id number]) endoscope was introduced through the mouth and advanced to the second portion of the duodenum , Without limitations.  The instrument was slowly withdrawn as the mucosa was fully examined.  There was severe cirumferential, ulcerative esophagitis (5-6cm in length starting at GE junction).  This appeared acid, vomiting related. There was a moderate amount of retained CT contrast, solid food that was all suctioned out (250-300cc).  The distal stomach and duodenal bulb were abnormal appearing but NOT overtly neoplastic.  The pylorus and proximal duodenal bulb appeared puckered inward (into the distal stomach).  The lumen in this area was narrowed and the mucosa clearly edematous but NOT frankly neoplastic appearing.  I was able to advance gastroscope through the narrowed, edematous lumen without much pressure and entered the more distal duodenal bulb and second duodenum which appeared normal.  The pylorus and proximal duodenal bulb was biopsied. Retroflexed views revealed no abnormalities.     The scope was then withdrawn from the patient and the procedure completed. COMPLICATIONS: There were no immediate  complications. ENDOSCOPIC IMPRESSION: See above.  This does not appear neoplastic within the lumen, perhaps there is extraluminal cancer? RECOMMENDATIONS: Await final pathology.  She needs BID PPI.  Her Will start clears and also scheduled reglan. eSigned:  Milus Banister, MD 01/09/2015 3:23 PM    CC: Earlie Raveling, MD

## 2015-01-09 NOTE — Progress Notes (Signed)
Utilization review completed. Pierce Biagini, RN, BSN. 

## 2015-01-09 NOTE — H&P (View-Only) (Signed)
Consultation  Referring Provider:  [facility]    Primary Care Physician:  Dwan Bolt, MD Primary Gastroenterologist:  Dr. Earlean Shawl. Reason for Consultation:   Gastric outlet obstruction           HPI:   Kristi Fuentes is a 69 y.o. 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 Amalia Hailey). I have reviewed latest office note from Dr. Tia Masker in [facility]. Patient underwent a subtotal pancreatectomy to include including a hepatic ligament lymph node. At time of surgery she also had a cholecystectomy, and splenectomy.  Patient started on on adjuvant therapy with Gemcitabine but developed Gemcitabine induced nephropathy as well as myelosuppression, despite dose reduction.She completed about 1/2 of therapy.  Patient eventually developed liver lesions and underwent biopsy which was compatible with adeno. A repeat MRI 3/23 revealed that liver lesions had significantly increased in size. Her CA19-9 had risen rapidly. Repeat biopsy showed recurrence of pancreatic adenocarcinoma. Patient wasn't interested in pursuing further chemo.   Patient presented to ED with vomiting yesterday. Noncontrast CTscan reveals gastric outlet obstruction.    Past Medical History  Diagnosis Date  . Diabetes mellitus   . Hypertension   . Chronic kidney disease   . Kidney stones   . Allergy   . Pancreatic cancer     Chemotherapy  . Primary pancreatic neuroendocrine tumor 2013  . Blood transfusion without reported diagnosis   . Heart murmur   . Thyroid disease 01/2002    2 radiaoactive iodine for cancer  . PONV (postoperative nausea and vomiting)   . Hypothyroidism     had thyroid removed 2002  . Cyst (solitary) of breast   . Simple cyst of kidney     unsure of which side maybe on both kidney's  . Incisional hernia   . Anemia   . Depression     takes Lamictal daily  . Status post chemotherapy     pancreatic histrory  .  Kidney failure     During chemotherapy for pancreatic cancer  . Breast cancer   . Sarcoma     Right buttock  . Hernia     Abdominal    Past Surgical History  Procedure Laterality Date  . Buttock sarcoma  2000  . 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 Abdominal hysterectomy    . Total knee arthroplasty      left  . Lipoma excision  04/20/2011    left arm  . Cholecystectomy    . Spleenectomy    . Breast surgery    . Urethral stricture dilatation  [id number]  . Pressure ulcer buttock  2005    from position during knee surgery   . Breast lumpectomy with needle localization and axillary sentinel lymph node bx Right 05/30/2013    Procedure: BREAST LUMPECTOMY WITH NEEDLE LOCALIZATION AND AXILLARY SENTINEL LYMPH NODE BX;  Surgeon: Harl Bowie, MD;  Location: [facility];  Service: General;  Laterality: Right;    Family History  Problem Relation Age of Onset  . Diabetes Father   . Kidney disease Father   . Cancer Father     colon  . Cancer Maternal Grandmother     breast     History  Substance Use Topics  . Smoking status: Never Smoker   . Smokeless tobacco: Never Used     Comment: never used tobacco  . Alcohol Use: No    Prior to Admission  medications   Medication Sig Start Date End Date Taking? Authorizing Provider  amLODipine (NORVASC) 10 MG tablet Take 10 mg by mouth daily.   Yes Historical Provider, MD  amoxicillin-clavulanate (AUGMENTIN) 875-125 MG per tablet Take 1 tablet by mouth daily. 01/05/15  Yes Delfina Redwood, MD  atenolol (TENORMIN) 25 MG tablet Take 12.5 mg by mouth daily.    Yes Historical Provider, MD  calcium carbonate (TUMS EX) 750 MG chewable tablet Chew 1 tablet by mouth 2 (two) times daily.   Yes Historical Provider, MD  clonazePAM (KLONOPIN) 0.5 MG tablet Take 0.5 mg by mouth as needed for anxiety.   Yes Historical Provider, MD  gabapentin (NEURONTIN) 300 MG capsule Take 300 mg by mouth 4 (four) times daily.   Yes Historical  Provider, MD  insulin aspart (NOVOLOG) 100 UNIT/ML injection Inject 3 Units into the skin 2 (two) times daily. Takes before breakfast and lunch.   Yes Historical Provider, MD  insulin glargine (LANTUS) 100 UNIT/ML injection Inject 22 Units into the skin daily before supper.   Yes Historical Provider, MD  lamoTRIgine (LAMICTAL) 200 MG tablet Take 200 mg by mouth daily.    Yes Historical Provider, MD  levothyroxine (SYNTHROID, LEVOTHROID) 150 MCG tablet Take 150 mcg by mouth daily.     Yes Historical Provider, MD  loratadine (CLARITIN) 10 MG tablet Take 10 mg by mouth daily.     Yes Historical Provider, MD  pravastatin (PRAVACHOL) 80 MG tablet Take 80 mg by mouth daily.     Yes Historical Provider, MD  tamoxifen (NOLVADEX) 20 MG tablet Take 1 tablet (20 mg total) by mouth daily. 07/09/14  Yes Volanda Napoleon, MD  zolpidem (AMBIEN) 5 MG tablet Take 1 tablet (5 mg total) by mouth at bedtime as needed for sleep. 01/05/15  Yes Delfina Redwood, MD    Current Facility-Administered Medications  Medication Dose Route Frequency Provider Last Rate Last Dose  . 0.9 %  sodium chloride infusion   Intravenous Continuous Etta Quill, DO 125 mL/hr at 01/09/15 0941    . ampicillin-sulbactam (UNASYN) 1.5 g in sodium chloride 0.9 % 50 mL IVPB  1.5 g Intravenous Q12H Veronda P Bryk, RPH   1.5 g at 01/09/15 0940  . clonazePAM (KLONOPIN) tablet 0.5 mg  0.5 mg Oral TID PRN Etta Quill, DO      . gabapentin (NEURONTIN) capsule 300 mg  300 mg Oral QID Etta Quill, DO   300 mg at 01/09/15 1001  . insulin aspart (novoLOG) injection 0-9 Units  0-9 Units Subcutaneous 6 times per day Etta Quill, DO   7 Units at 01/09/15 0405  . insulin glargine (LANTUS) injection 15 Units  15 Units Subcutaneous QHS Etta Quill, DO   15 Units at 01/08/15 2245  . lamoTRIgine (LAMICTAL) tablet 200 mg  200 mg Oral Daily Etta Quill, DO   200 mg at 01/09/15 1000  . levothyroxine (SYNTHROID, LEVOTHROID) injection 75 mcg  75  mcg Intravenous Daily Debbe Odea, MD   75 mcg at 01/09/15 1001  . loratadine (CLARITIN) tablet 10 mg  10 mg Oral Daily Etta Quill, DO   10 mg at 01/09/15 1000  . metoprolol (LOPRESSOR) injection 5 mg  5 mg Intravenous 4 times per day Debbe Odea, MD   5 mg at 01/09/15 0730  . ondansetron (ZOFRAN) injection 4 mg  4 mg Intravenous Q6H PRN Etta Quill, DO      . promethazine (PHENERGAN) injection  12.5 mg  12.5 mg Intravenous Q6H PRN Etta Quill, DO      . tamoxifen (NOLVADEX) tablet 20 mg  20 mg Oral Daily Etta Quill, DO   20 mg at 01/09/15 1000  . zolpidem (AMBIEN) tablet 5 mg  5 mg Oral QHS PRN Etta Quill, DO   5 mg at 01/09/15 0206    Allergies as of 01/08/2015 - Review Complete 01/08/2015  Allergen Reaction Noted  . Metoclopramide hcl  10/16/2011  . Ramipril Cough 04/05/2011    Review of Systems:    All systems reviewed and negative except where noted in HPI.   Physical Exam:  Vital signs in last 24 hours: Temp:  [99.1 F (37.3 C)-100.2 F (37.9 C)] 100 F (37.8 C) (05/06 0358) Pulse Rate:  [59-80] 64 (05/06 0358) Resp:  [15-20] 18 (05/06 0358) BP: (115-137)/(46-89) 137/63 mmHg (05/06 0358) SpO2:  [95 %-100 %] 96 % (05/06 0358) Weight:  [155 lb 1.6 oz (70.353 kg)-157 lb 6.4 oz (71.396 kg)] 155 lb 1.6 oz (70.353 kg) (05/06 0051) Last BM Date: 01/08/15 General:   Pleasant white female in NAD Head:  Normocephalic and atraumatic. Eyes:   No icterus.   Conjunctiva pink. Ears:  Normal auditory acuity. Neck:  Supple; no masses felt Lungs:  Respirations even and unlabored. Lungs clear to auscultation bilaterally.   No wheezes, crackles, or rhonchi.  Heart:  Regular rate and rhythm;  murmur heard. Abdomen:  Soft, nondistended, nontender. A few bowel sounds. No appreciable masses Rectal:  Not performed.  Msk:  Symmetrical without gross deformities.  Extremities:  Without edema. Neurologic:  Alert and  oriented x4;  grossly normal neurologically. Skin:   Intact without significant lesions or rashes. Cervical Nodes:  No significant cervical adenopathy. Psych:  Alert and cooperative. Normal affect.  LAB RESULTS:  Recent Labs  01/08/15 1952 01/09/15 0334  WBC 19.9* 18.3*  HGB 9.3* 8.3*  HCT 29.6* 25.3*  PLT 327 300   BMET  Recent Labs  01/08/15 1952 01/09/15 0334  NA 144 142  K 4.7 5.0  CL 104 104  CO2 27 26  GLUCOSE 270* 371*  BUN 38* 36*  CREATININE 2.83* 2.76*  CALCIUM 8.1* 7.7*   LFT  Recent Labs  01/08/15 1952  PROT 6.0*  ALBUMIN 3.2*  AST 24  ALT 27  ALKPHOS 71  BILITOT 0.5   STUDIES: Ct Abdomen Pelvis Wo Contrast  01/09/2015   CLINICAL DATA:  Pancreatic carcinoma, metastatic to the liver. Worsening epigastric pain with nausea and vomiting.  EXAM: CT ABDOMEN AND PELVIS WITHOUT CONTRAST  TECHNIQUE: Multidetector CT imaging of the abdomen and pelvis was performed following the standard protocol without IV contrast.  COMPARISON:  None.  FINDINGS: There is marked distention of the gastric fundus and body. The antrum and pylorus are nondistended and mildly irregular. The abrupt transition to narrowed antrum from prominently distended fundus and body is concerning for the possibility of neoplastic restriction of the gastric antrum. There is a large volume of retained food in the stomach, in addition to the oral contrast. The duodenum appears grossly normal. Remainder of the small bowel is unremarkable. Colon is unremarkable. There is no extraluminal air.  Multiple hepatic masses are present, consistent with the described history of metastatic pancreas carcinoma. No bile duct dilatation is evident.  There is no ascites.  There are numerous small peripheral cysts about both kidneys. There is no hydronephrosis or ureteral dilatation. Urinary bladder appears unremarkable.  There is prior splenectomy,  cholecystectomy and hysterectomy.  There is a small right pleural effusion and a trace left pleural effusion. There is mild  dependent base airspace opacity in the right lung which may be atelectatic.  IMPRESSION: 1. Marked distention of the gastric fundus and body, raising the question of a gastric outlet obstruction in the antrum or pylorus. There is a large volume of retained food in the stomach. 2. Multiple hepatic masses consistent with the described history. 3. Small right pleural effusion. Trace left pleural effusion. Mild atelectatic -appearing right lung base opacities.   Electronically Signed   By: Andreas Newport M.D.   On: 01/09/2015 01:53   Dg Chest 2 View  01/08/2015   CLINICAL DATA:  Chest pain, shortness of breath  EXAM: CHEST  2 VIEW  COMPARISON:  01/01/2015  FINDINGS: Small right pleural effusion. No left pleural effusion. No focal consolidation or pneumothorax. Stable heart and mediastinum. Calcified mediastinal lymph node. No acute osseous abnormality.  IMPRESSION: Small right pleural effusion.   Electronically Signed   By: Kathreen Devoid   On: 01/08/2015 20:56    PREVIOUS ENDOSCOPIES:            At [facility]   Impression / Plan:    1. Pleasant 69 year old female with metastatic pancreatic cancer. Originally diagnosed in 2012 at [facility], she had neuroendocrine tumor in one pancreatic lesion and adeno in another lesion. She underwent a subtotal pancreatectomy followed by chemo.  Did okay until recently found to have recurrent cancer with liver mets.  Patient not interested in trying chemo again. Admitted yesterday with vomiting and gastric outlet obstruction. Patient wants all efforts to be palliative.     Suspect malignant obstruction which may or may not be amenable to stenting. If not then venting PEG may be an option. She is no longer vomiting nor distended so NGT not placed. Still may need one prior to an EGD to make sure stomach is empty.  2. History of breast cancer, she is 2 years out from treatment now  3. [facility] of colon cancer in father (in his 59s). Patient thinks she had a colonoscopy 5 years ago in  [place]. I am unable to determine who performed it on [facility] (no [facility] nor Dr. Benson Norway)  Thanks   LOS: 1 day   Tye Savoy  01/09/2015, 10:18 AM   ________________________________________________________________________  Velora Heckler GI MD note:  I personally examined the patient, reviewed the data and agree with the assessment and plan described above.  She has incomplete GOO (CT contrast passed into small bowel and her vomiting/distension has stopped).  I recommended we proceed with EGD today to help with diagnosis.  I suspect this is malignant obstruction given her known metastatic pancreatic cancer.  It is interesting that initial pancreatic tumor was neuroendocrine and mets were adenocarcinoma.  Perhaps she actually has gastric primary with mets to liver?  I discussed stents, decompressive PEGs.  She prefers to not do anything too invasive, permanent currently and would hope that modifying her diet will help.  I called her gastroenterologist's office, Dr. Earlean Shawl, however he is out of town this weekend and did not schedule dedicated coverage for his patients.  I am happy to help her now and she can follow up with him after this admission.   Owens Loffler, MD [facility] Pager [phone]

## 2015-01-09 NOTE — Interval H&P Note (Signed)
History and Physical Interval Note:  01/09/2015 2:07 PM  Kristi Fuentes  has presented today for surgery, with the diagnosis of gastric outlet obstruction, metastatic  pancreatic cancer  The various methods of treatment have been discussed with the patient and family. After consideration of risks, benefits and other options for treatment, the patient has consented to  Procedure(s): ESOPHAGOGASTRODUODENOSCOPY (EGD) (N/A) as a surgical intervention .  The patient's history has been reviewed, patient examined, no change in status, stable for surgery.  I have reviewed the patient's chart and labs.  Questions were answered to the patient's satisfaction.     Milus Banister

## 2015-01-09 NOTE — Progress Notes (Signed)
[facility] Progress Note   NYCOLE KAWAHARA BJS:[id number] DOB: 11-19-1945 DOA: 01/08/2015 PCP: Dwan Bolt, MD  Brief narrative: Kristi Fuentes is a 69 y.o. 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 CT of the abdomen pelvis reveals a markedly distended gastric fundus and body with a possibility of gastric outlet obstruction in the antrum or the pylorus.   Subjective: Currently not nauseated-has not vomited since yesterday. No abdominal pain or diarrhea.  Assessment/Plan: Principal Problem:   Nausea & vomiting/  Chest pain -Possible gastric outlet obstruction-patient understands that any treatment will be palliative but she would like to see if there is an option for treatment-I have consulted GI- nothing by mouth for now -Chest pain likely secondary to vomiting and GI issues-now resolved -Continue IV fluids while nothing by mouth  Active Problems: Pancreatitic cancer -As a history of neuroendocrine pancreatic cancer in the past (2012 ) and is status post partial pancreatectomy - Now diagnosed with adenocarcinoma with metastasis in the liver- patient has opted in March to forgo chemotherapy - she is asking for a palliative care eval which I have requested for goals of care and possibly hospice at home    HTN (hypertension) -Change atenolol to IV Lopressor-hold Norvasc for now    Leukocytosis- recent admission for aspiration pneumonia - Stop Augmentin as she will now be nothing by mouth and transitioned to Unasyn -May have reaspirated during episodes of vomiting over the past couple of days -Blood cultures obtained-   Hypothyroidism --Transition Synthroid to IV while NPO  Diabetes mellitus -Continue Lantus at current dose along with sliding scale- all  sugars  Stage 3 chronic kidney disease -Baseline appears to be anywhere between 2 and 3.6 therefore currently at baseline    Code Status: DO NOT RESUSCITATE Family Communication:  Disposition Plan: f/u on GI eval DVT prophylaxis: SCDs, heparin Consultants: GI, palliative care Procedures:  Antibiotics: Anti-infectives    Start     Dose/Rate Route Frequency Ordered Stop   01/09/15 1000  amoxicillin-clavulanate (AUGMENTIN) 875-125 MG per tablet 1 tablet  Status:  Discontinued     1 tablet Oral Daily 01/08/15 2256 01/09/15 0717   01/09/15 0800  ampicillin-sulbactam (UNASYN) 1.5 g in sodium chloride 0.9 % 50 mL IVPB     1.5 g 100 mL/hr over 30 Minutes Intravenous Every 12 hours 01/09/15 0728     01/08/15 2300  ampicillin-sulbactam (UNASYN) 1.5 g in sodium chloride 0.9 % 50 mL IVPB  Status:  Discontinued     1.5 g 100 mL/hr over 30 Minutes Intravenous  Once 01/08/15 2253 01/08/15 2255      Objective: Filed Weights   01/08/15 1945 01/09/15 0051  Weight: 71.396 kg (157 lb 6.4 oz) 70.353 kg (155 lb 1.6 oz)    Intake/Output Summary (Last 24 hours) at 01/09/15 1323 Last data filed at 01/09/15 0730  Gross per 24 hour  Intake      0 ml  Output      0 ml  Net      0 ml     Vitals Filed Vitals:   01/08/15 2329 01/09/15 0051 01/09/15 0358 01/09/15 1300  BP:  121/55 137/63 124/60  Pulse:  80 64 61  Temp: 100.2 F (37.9 C) 99.8 F (37.7 C) 100 F (37.8 C) 99.8 F (37.7 C)  TempSrc: Rectal Oral  Oral Oral  Resp:  18 18 18   Height:      Weight:  70.353 kg (155 lb 1.6 oz)    SpO2:  95% 96% 94%    Exam:  General:  Pt is alert, not in acute distress  HEENT: No icterus, No thrush  Cardiovascular: regular rate and rhythm, S1/S2 No murmur  Respiratory: clear to auscultation bilaterally   Abdomen: Soft, +Bowel sounds, non tender, non distended, no guarding  MSK: No LE edema, cyanosis or clubbing  Data Reviewed: Basic Metabolic Panel:  Recent Labs Lab 01/04/15 0642  01/08/15 1952 01/09/15 0334  NA 143 144 142  K 3.4* 4.7 5.0  CL 106 104 104  CO2 26 27 26   GLUCOSE 156* 270* 371*  BUN 42* 38* 36*  CREATININE 2.79* 2.83* 2.76*  CALCIUM 6.6* 8.1* 7.7*   Liver Function Tests:  Recent Labs Lab 01/08/15 1952  AST 24  ALT 27  ALKPHOS 66  BILITOT 0.5  PROT 6.0*  ALBUMIN 3.2*    Recent Labs Lab 01/08/15 2000  LIPASE 24   No results for input(s): AMMONIA in the last 168 hours. CBC:  Recent Labs Lab 01/04/15 0642 01/08/15 1952 01/09/15 0334  WBC 10.1 19.9* 18.3*  NEUTROABS  --  15.8*  --   HGB 8.9* 9.3* 8.3*  HCT 27.2* 29.6* 25.3*  MCV 95.4 99.7 99.6  PLT 284 327 300   Cardiac Enzymes:  Recent Labs Lab 01/08/15 2000  TROPONINI 0.03   BNP (last 3 results) No results for input(s): BNP in the last 8760 hours.  ProBNP (last 3 results) No results for input(s): PROBNP in the last 8760 hours.  CBG:  Recent Labs Lab 01/09/15 0008 01/09/15 0353 01/09/15 0828 01/09/15 1113 01/09/15 1253  GLUCAP 319* 340* 132* 93 109*    Recent Results (from the past 240 hour(s))  MRSA PCR Screening     Status: None   Collection Time: 12/30/14  3:09 PM  Result Value Ref Range Status   MRSA by PCR NEGATIVE NEGATIVE Final    Comment:        The GeneXpert MRSA Assay (FDA approved for NASAL specimens only), is one component of a comprehensive MRSA colonization surveillance program. It is not intended to diagnose MRSA infection nor to guide or monitor treatment for MRSA infections.      Studies: Ct Abdomen Pelvis Wo Contrast  01/09/2015   CLINICAL DATA:  Pancreatic carcinoma, metastatic to the liver. Worsening epigastric pain with nausea and vomiting.  EXAM: CT ABDOMEN AND PELVIS WITHOUT CONTRAST  TECHNIQUE: Multidetector CT imaging of the abdomen and pelvis was performed following the standard protocol without IV contrast.  COMPARISON:  None.  FINDINGS: There is marked distention of the gastric fundus and body. The antrum and pylorus  are nondistended and mildly irregular. The abrupt transition to narrowed antrum from prominently distended fundus and body is concerning for the possibility of neoplastic restriction of the gastric antrum. There is a large volume of retained food in the stomach, in addition to the oral contrast. The duodenum appears grossly normal. Remainder of the small bowel is unremarkable. Colon is unremarkable. There is no extraluminal air.  Multiple hepatic masses are present, consistent with the described history of metastatic pancreas carcinoma. No bile duct dilatation is evident.  There is no ascites.  There are numerous small peripheral cysts about both kidneys. There is no hydronephrosis or ureteral dilatation. Urinary bladder appears unremarkable.  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  There is  a small right pleural effusion and a trace left pleural effusion. There is mild dependent base airspace opacity in the right lung which may be atelectatic.  IMPRESSION: 1. Marked distention of the gastric fundus and body, raising the question of a gastric outlet obstruction in the antrum or pylorus. There is a large volume of retained food in the stomach. 2. Multiple hepatic masses consistent with the described history. 3. Small right pleural effusion. Trace left pleural effusion. Mild atelectatic -appearing right lung base opacities.   Electronically Signed   By: Andreas Newport M.D.   On: 01/09/2015 01:53   Dg Chest 2 View  01/08/2015   CLINICAL DATA:  Chest pain, shortness of breath  EXAM: CHEST  2 VIEW  COMPARISON:  01/01/2015  FINDINGS: Small right pleural effusion. No left pleural effusion. No focal consolidation or pneumothorax. Stable heart and mediastinum. Calcified mediastinal lymph node. No acute osseous abnormality.  IMPRESSION: Small right pleural effusion.   Electronically Signed   By: Kathreen Devoid   On: 01/08/2015 20:56    Scheduled Meds:  Scheduled Meds: . ampicillin-sulbactam  (UNASYN) IV  1.5 g Intravenous Q12H  . gabapentin  300 mg Oral QID  . insulin aspart  0-9 Units Subcutaneous 6 times per day  . insulin glargine  15 Units Subcutaneous QHS  . lamoTRIgine  200 mg Oral Daily  . levothyroxine  75 mcg Intravenous Daily  . loratadine  10 mg Oral Daily  . metoprolol  5 mg Intravenous 4 times per day  . tamoxifen  20 mg Oral Daily   Continuous Infusions: . sodium chloride 125 mL/hr at 01/09/15 0941    Time spent on care of this patient: 35 minutes [place], MD 01/09/2015, 1:23 PM  LOS: 1 day   [facility] Office  [phone] Pager - Text Page per [email or web address]  If 7PM-7AM, please contact night-coverage [email or web address]

## 2015-01-10 DIAGNOSIS — C787 Secondary malignant neoplasm of liver and intrahepatic bile duct: Secondary | ICD-10-CM

## 2015-01-10 DIAGNOSIS — C259 Malignant neoplasm of pancreas, unspecified: Secondary | ICD-10-CM | POA: Insufficient documentation

## 2015-01-10 LAB — CBC
HCT: 26.3 % — ABNORMAL LOW (ref 36.0–46.0)
HEMOGLOBIN: 8.3 g/dL — AB (ref 12.0–15.0)
MCH: 32 pg (ref 26.0–34.0)
MCHC: 31.6 g/dL (ref 30.0–36.0)
MCV: 101.5 fL — ABNORMAL HIGH (ref 78.0–100.0)
Platelets: 287 10*3/uL (ref 150–400)
RBC: 2.59 MIL/uL — ABNORMAL LOW (ref 3.87–5.11)
RDW: 15.6 % — ABNORMAL HIGH (ref 11.5–15.5)
WBC: 13.1 10*3/uL — ABNORMAL HIGH (ref 4.0–10.5)

## 2015-01-10 LAB — BASIC METABOLIC PANEL
Anion gap: 8 (ref 5–15)
BUN: 29 mg/dL — AB (ref 6–20)
CALCIUM: 7 mg/dL — AB (ref 8.9–10.3)
CO2: 25 mmol/L (ref 22–32)
CREATININE: 2.74 mg/dL — AB (ref 0.44–1.00)
Chloride: 109 mmol/L (ref 101–111)
GFR calc Af Amer: 19 mL/min — ABNORMAL LOW (ref >60)
GFR, EST NON AFRICAN AMERICAN: 16 mL/min — AB (ref >60)
GLUCOSE: 102 mg/dL — AB (ref 70–99)
Potassium: 4.5 mmol/L (ref 3.5–5.1)
Sodium: 142 mmol/L (ref 135–145)

## 2015-01-10 LAB — GLUCOSE, CAPILLARY
GLUCOSE-CAPILLARY: 110 mg/dL — AB (ref 70–99)
GLUCOSE-CAPILLARY: 102 mg/dL — AB (ref 70–99)
Glucose-Capillary: 155 mg/dL — ABNORMAL HIGH (ref 70–99)
Glucose-Capillary: 199 mg/dL — ABNORMAL HIGH (ref 70–99)
Glucose-Capillary: 224 mg/dL — ABNORMAL HIGH (ref 70–99)
Glucose-Capillary: 306 mg/dL — ABNORMAL HIGH (ref 70–99)

## 2015-01-10 MED ORDER — GABAPENTIN 600 MG PO TABS
600.0000 mg | ORAL_TABLET | Freq: Every day | ORAL | Status: DC
  Administered 2015-01-10: 600 mg via ORAL
  Filled 2015-01-10 (×2): qty 1

## 2015-01-10 MED ORDER — ATENOLOL 12.5 MG HALF TABLET
12.5000 mg | ORAL_TABLET | Freq: Every day | ORAL | Status: DC
Start: 2015-01-10 — End: 2015-01-10

## 2015-01-10 MED ORDER — ATENOLOL 12.5 MG HALF TABLET
12.5000 mg | ORAL_TABLET | Freq: Every day | ORAL | Status: DC
  Administered 2015-01-10 – 2015-01-11 (×2): 12.5 mg via ORAL
  Filled 2015-01-10 (×2): qty 1

## 2015-01-10 MED ORDER — AMOXICILLIN-POT CLAVULANATE 400-57 MG/5ML PO SUSR
500.0000 mg | Freq: Two times a day (BID) | ORAL | Status: DC
  Administered 2015-01-10 – 2015-01-11 (×3): 500 mg via ORAL
  Filled 2015-01-10 (×5): qty 6.3

## 2015-01-10 NOTE — Progress Notes (Signed)
Palliative Medicine Team consult was received. We will schedule a meeting with patient and her family at the earliest possible time we have a provider available. Anticipated time to consult PM on 5/7 or early AM on 5/8. Discussed with attending- plan for home with hospice which would be very appropriate given her cancer progression and desire for no additional treatment-full comfort. Will assist with these goals- ok for CM to engage and determine what her needs may be at home in preparation for discharge and offer choice of hospice provider.  Lane Hacker, DO Palliative Medicine [phone]

## 2015-01-10 NOTE — Progress Notes (Signed)
Pt husband wanting to speak with MD; MD paged; will await callback.

## 2015-01-10 NOTE — Progress Notes (Signed)
[facility] Progress Note    Since last GI note: EGD yesterday, see report in chart.  No nausea or vomiting on clears overnight, this AM  Objective: Vital signs in last 24 hours: Temp:  [98.6 F (37 C)-100 F (37.8 C)] 98.6 F (37 C) (05/07 0412) Pulse Rate:  [55-66] 55 (05/07 0412) Resp:  [13-20] 16 (05/07 0412) BP: (113-163)/(55-85) 130/59 mmHg (05/07 0412) SpO2:  [90 %-100 %] 90 % (05/07 0412) Last BM Date: 01/08/15 General: alert and oriented times 3 Heart: regular rate and rythm Abdomen: soft, non-tender, non-distended, normal bowel sounds   Lab Results:  Recent Labs  01/08/15 1952 01/09/15 0334 01/10/15 0532  WBC 19.9* 18.3* 13.1*  HGB 9.3* 8.3* 8.3*  PLT 327 300 287  MCV 99.7 99.6 101.5*    Recent Labs  01/08/15 1952 01/09/15 0334 01/10/15 0532  NA 144 142 142  K 4.7 5.0 4.5  CL 104 104 109  CO2 27 26 25   GLUCOSE 270* 371* 102*  BUN 38* 36* 29*  CREATININE 2.83* 2.76* 2.74*  CALCIUM 8.1* 7.7* 7.0*    Recent Labs  01/08/15 1952  PROT 6.0*  ALBUMIN 3.2*  AST 24  ALT 27  ALKPHOS 66  BILITOT 0.5   No results for input(s): INR in the last 72 hours.   Studies/Results: Ct Abdomen Pelvis Wo Contrast  01/09/2015   CLINICAL DATA:  Pancreatic carcinoma, metastatic to the liver. Worsening epigastric pain with nausea and vomiting.  EXAM: CT ABDOMEN AND PELVIS WITHOUT CONTRAST  TECHNIQUE: Multidetector CT imaging of the abdomen and pelvis was performed following the standard protocol without IV contrast.  COMPARISON:  None.  FINDINGS: There is marked distention of the gastric fundus and body. The antrum and pylorus are nondistended and mildly irregular. The abrupt transition to narrowed antrum from prominently distended fundus and body is concerning for the possibility of neoplastic restriction of the gastric antrum. There is a large volume of retained food in the stomach, in addition to the oral contrast. The duodenum appears grossly normal.  Remainder of the small bowel is unremarkable. Colon is unremarkable. There is no extraluminal air.  Multiple hepatic masses are present, consistent with the described history of metastatic pancreas carcinoma. No bile duct dilatation is evident.  There is no ascites.  There are numerous small peripheral cysts about both kidneys. There is no hydronephrosis or ureteral dilatation. Urinary bladder appears unremarkable.  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  There is a small right pleural effusion and a trace left pleural effusion. There is mild dependent base airspace opacity in the right lung which may be atelectatic.  IMPRESSION: 1. Marked distention of the gastric fundus and body, raising the question of a gastric outlet obstruction in the antrum or pylorus. There is a large volume of retained food in the stomach. 2. Multiple hepatic masses consistent with the described history. 3. Small right pleural effusion. Trace left pleural effusion. Mild atelectatic -appearing right lung base opacities.   Electronically Signed   By: Andreas Newport M.D.   On: 01/09/2015 01:53   Dg Chest 2 View  01/08/2015   CLINICAL DATA:  Chest pain, shortness of breath  EXAM: CHEST  2 VIEW  COMPARISON:  01/01/2015  FINDINGS: Small right pleural effusion. No left pleural effusion. No focal consolidation or pneumothorax. Stable heart and mediastinum. Calcified mediastinal lymph node. No acute osseous abnormality.  IMPRESSION: Small right pleural effusion.   Electronically Signed   By: Kathreen Devoid   On:  01/08/2015 20:56     Medications: Scheduled Meds: . ampicillin-sulbactam (UNASYN) IV  1.5 g Intravenous Q12H  . gabapentin  300 mg Oral QID  . heparin subcutaneous  5,000 Units Subcutaneous 3 times per day  . insulin aspart  0-9 Units Subcutaneous 6 times per day  . insulin glargine  15 Units Subcutaneous QHS  . lamoTRIgine  200 mg Oral Daily  . levothyroxine  75 mcg Intravenous Daily  . metoprolol  5  mg Intravenous 4 times per day  . ondansetron  4 mg Intravenous BID  . pantoprazole (PROTONIX) IV  40 mg Intravenous Q12H  . tamoxifen  20 mg Oral Daily   Continuous Infusions: . sodium chloride 125 mL/hr at 01/10/15 0649   PRN Meds:.clonazePAM, ondansetron (ZOFRAN) IV, promethazine, zolpidem    Assessment/Plan: 69 y.o. female with abnormal distal stomach causing incomplete GOO  The distal stomach was not clearly neoplastic but there could be infiltrative process in the wall, there could be extrinsic compression. 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  Alternatively, this could be a completely benign process, perhaps related to her intentional ASA OD (suicide attempt) 2 weeks ago. That may have caused gastritis, perhaps the anatomic abnormality is a site of healing ulcer, erosions?  Mucosal biopsies will be back on Monday.  I will advance her diet to full liquids, she should not advance past that for at least 3-4 days to allow time for PPI to begin some healing of esophagitis, distal stomach.  If she is feeling well by tomorrow afternoon on full liquids (sign of GOO again) then she can go home.    Milus Banister, MD  01/10/2015, 8:32 AM [facility] Pager [phone]

## 2015-01-10 NOTE — Progress Notes (Addendum)
[facility] Progress Note   Kristi Fuentes QQP:[id number] DOB: 09/05/1946 DOA: 01/08/2015 PCP: Dwan Bolt, MD  Brief narrative: Kristi Fuentes is a 69 y.o. 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 CT of the abdomen pelvis reveals a markedly distended gastric fundus and body with a possibility of gastric outlet obstruction in the antrum or the pylorus.   Subjective: No nausea vomiting or abdominal pain. Tolerating clear liquids.  Assessment/Plan: Principal Problem:   Nausea & vomiting/  Chest pain -EGD reveals narrowing of the duodenum in a circumferential manner-biopsies obtained-Dr. 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 ) and is status post partial pancreatectomy - Now diagnosed with adenocarcinoma with metastasis in the liver- patient has opted in March to forgo chemotherapy - she is asking for a palliative care eval which I have requested for goals of care and possibly hospice at home    HTN (hypertension) -Change atenolol to IV Lopressor temporarily-will transition back to atenolol today to ensure she is tolerating it -hold Norvasc for now as BP not requiring  Diabetic neuropathy -Dosage of gabapentin adjusted based on renal function- currently on 600 mg daily    Leukocytosis- recent admission for aspiration pneumonia -May have reaspirated during episodes of vomiting over the past couple of days - WBC count improved today - Attempt to transition Unasyn  to liquid Augmentin today- recommend 5 more days-stop date 5/12 -Blood cultures obtained and negative thus far   Hypothyroidism --Continue Synthroid  Diabetes mellitus -Continue Lantus at current dose along with sliding scale- follow sugars  Stage 3 chronic kidney disease -Baseline appears to be anywhere between 2 and 3.6 therefore currently at baseline    Code Status: DO NOT RESUSCITATE Family Communication: Spoke with husband today and son Dr Arthor Captain  Disposition Plan: Home when stable DVT prophylaxis: SCDs, heparin Consultants: GI, palliative care Procedures:  Antibiotics: Anti-infectives    Start     Dose/Rate Route Frequency Ordered Stop   01/10/15 1130  amoxicillin-clavulanate (AUGMENTIN) 400-57 MG/5ML suspension 500 mg     500 mg Oral Every 12 hours 01/10/15 1110     01/09/15 1000  amoxicillin-clavulanate (AUGMENTIN) 875-125 MG per tablet 1 tablet  Status:  Discontinued     1 tablet Oral Daily 01/08/15 2256 01/09/15 0717   01/09/15 0800  ampicillin-sulbactam (UNASYN) 1.5 g in sodium chloride 0.9 % 50 mL IVPB  Status:  Discontinued     1.5 g 100 mL/hr over 30 Minutes Intravenous Every 12 hours 01/09/15 0728 01/10/15 1110   01/08/15 2300  ampicillin-sulbactam (UNASYN) 1.5 g in sodium chloride 0.9 % 50 mL IVPB  Status:  Discontinued     1.5 g 100 mL/hr over 30 Minutes Intravenous  Once 01/08/15 2253 01/08/15 2255      Objective: Filed Weights   01/08/15 1945 01/09/15 0051  Weight: 71.396 kg (157 lb 6.4 oz) 70.353 kg (155 lb 1.6 oz)    Intake/Output Summary (Last 24 hours) at 01/10/15 1138 Last data filed at 01/10/15 7124  Gross per 24 hour  Intake    840 ml  Output    850 ml  Net    -10 ml     Vitals Filed Vitals:   01/09/15 1520 01/09/15 1525 01/09/15 1747 01/10/15 0412  BP: 113/64 125/63 123/56 130/59  Pulse: 59 58 60 55  Temp: 99 F (37.2 C)   98.6 F (37 C)  TempSrc: Oral   Oral  Resp: 17 18  16   Height:      Weight:      SpO2: 100% 100%  90%     Exam:  General:  Pt is alert, not in acute distress  HEENT: No icterus, No thrush  Cardiovascular: regular rate and rhythm, S1/S2 No murmur  Respiratory: clear to auscultation bilaterally   Abdomen: Soft, +Bowel sounds, non tender, non distended, no guarding  MSK: No LE edema, cyanosis or clubbing  Data Reviewed: Basic Metabolic Panel:  Recent Labs Lab 01/04/15 0642 01/08/15 1952 01/09/15 0334 01/10/15 0532  NA 143 144 142 142  K 3.4* 4.7 5.0 4.5  CL 106 104 104 109  CO2 26 27 26 25   GLUCOSE 156* 270* 371* 102*  BUN 42* 38* 36* 29*  CREATININE 2.79* 2.83* 2.76* 2.74*  CALCIUM 6.6* 8.1* 7.7* 7.0*   Liver Function Tests:  Recent Labs Lab 01/08/15 1952  AST 24  ALT 27  ALKPHOS 66  BILITOT 0.5  PROT 6.0*  ALBUMIN 3.2*    Recent Labs Lab 01/08/15 2000  LIPASE 24   No results for input(s): AMMONIA in the last 168 hours. CBC:  Recent Labs Lab 01/04/15 0642 01/08/15 1952 01/09/15 0334 01/10/15 0532  WBC 10.1 19.9* 18.3* 13.1*  NEUTROABS  --  15.8*  --   --   HGB 8.9* 9.3* 8.3* 8.3*  HCT 27.2* 29.6* 25.3* 26.3*  MCV 95.4 99.7 99.6 101.5*  PLT 284 327 300 287   Cardiac Enzymes:  Recent Labs Lab 01/08/15 2000  TROPONINI 0.03   BNP (last 3 results) No results for input(s): BNP in the last 8760 hours.  ProBNP (last 3 results) No results for input(s): PROBNP in the last 8760 hours.  CBG:  Recent Labs Lab 01/09/15 1638 01/09/15 2043 01/10/15 0002 01/10/15 0408 01/10/15 0809  GLUCAP 97 171* 155* 110* 102*    Recent Results (from the past 240 hour(s))  Blood culture (routine x 2)     Status: None (Preliminary result)   Collection Time: 01/08/15 10:30 PM  Result Value Ref Range Status   Specimen Description BLOOD LEFT ARM  Final   Special Requests BOTTLES DRAWN AEROBIC AND ANAEROBIC 10CC  Final   Culture   Final           BLOOD CULTURE RECEIVED NO GROWTH TO DATE CULTURE WILL BE HELD FOR 5 DAYS BEFORE ISSUING A FINAL NEGATIVE  REPORT Performed at [facility]    Report Status PENDING  Incomplete  Blood culture (routine x 2)     Status: None (Preliminary result)   Collection Time: 01/08/15 10:42 PM  Result Value Ref Range Status   Specimen Description BLOOD LEFT HAND  Final   Special Requests BOTTLES DRAWN AEROBIC ONLY 4CC  Final   Culture   Final           BLOOD CULTURE RECEIVED NO GROWTH TO DATE CULTURE WILL BE HELD FOR 5 DAYS BEFORE ISSUING A FINAL NEGATIVE REPORT Performed at [facility]    Report Status PENDING  Incomplete     Studies: Ct Abdomen Pelvis Wo Contrast  01/09/2015   CLINICAL DATA:  Pancreatic carcinoma, metastatic to the liver. Worsening epigastric pain with nausea and vomiting.  EXAM: CT ABDOMEN AND PELVIS WITHOUT CONTRAST  TECHNIQUE: Multidetector CT imaging of the abdomen and pelvis was performed following the standard protocol without IV contrast.  COMPARISON:  None.  FINDINGS: There is marked distention of the gastric fundus and body. The antrum and pylorus are nondistended and mildly irregular. The abrupt transition to narrowed antrum from prominently distended fundus and body is concerning for the possibility of neoplastic restriction of the gastric antrum. There is a large volume of retained food in the stomach, in addition to the oral contrast. The duodenum appears grossly normal. Remainder of the small bowel is unremarkable. Colon is unremarkable. There is no extraluminal air.  Multiple hepatic masses are present, consistent with the described history of metastatic pancreas carcinoma. No bile duct dilatation is evident.  There is no ascites.  There are numerous small peripheral cysts about both kidneys. There is no hydronephrosis or ureteral dilatation. Urinary bladder appears unremarkable.  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  There is a small right pleural effusion and a trace left pleural effusion. There is mild dependent base airspace opacity in the  right lung which may be atelectatic.  IMPRESSION: 1. Marked distention of the gastric fundus and body, raising the question of a gastric outlet obstruction in the antrum or pylorus. There is a large volume of retained food in the stomach. 2. Multiple hepatic masses consistent with the described history. 3. Small right pleural effusion. Trace left pleural effusion. Mild atelectatic -appearing right lung base opacities.   Electronically Signed   By: Andreas Newport M.D.   On: 01/09/2015 01:53   Dg Chest 2 View  01/08/2015   CLINICAL DATA:  Chest pain, shortness of breath  EXAM: CHEST  2 VIEW  COMPARISON:  01/01/2015  FINDINGS: Small right pleural effusion. No left pleural effusion. No focal consolidation or pneumothorax. Stable heart and mediastinum. Calcified mediastinal lymph node. No acute osseous abnormality.  IMPRESSION: Small right pleural effusion.   Electronically Signed   By: Kathreen Devoid   On: 01/08/2015 20:56    Scheduled Meds:  Scheduled Meds: . amoxicillin-clavulanate  500 mg Oral Q12H  . atenolol  12.5 mg Oral Daily  . gabapentin  600 mg Oral QHS  . heparin subcutaneous  5,000 Units Subcutaneous 3 times per day  . insulin aspart  0-9 Units Subcutaneous 6 times per day  . insulin glargine  15 Units Subcutaneous QHS  . lamoTRIgine  200 mg Oral Daily  . ondansetron  4 mg Intravenous BID  . pantoprazole (PROTONIX) IV  40 mg Intravenous Q12H  . tamoxifen  20 mg Oral Daily   Continuous Infusions: . sodium chloride 125 mL/hr at 01/10/15 0649    Time spent on care of this patient: 35 minutes [place], MD 01/10/2015, 11:38 AM  LOS: 2 days   [facility] Office  [phone] Pager - Text Page per [email or web address]  If 7PM-7AM, please contact night-coverage [email or web address]

## 2015-01-11 DIAGNOSIS — Z515 Encounter for palliative care: Secondary | ICD-10-CM | POA: Insufficient documentation

## 2015-01-11 LAB — CBC
HCT: 24.7 % — ABNORMAL LOW (ref 36.0–46.0)
HEMOGLOBIN: 8 g/dL — AB (ref 12.0–15.0)
MCH: 31.9 pg (ref 26.0–34.0)
MCHC: 32.4 g/dL (ref 30.0–36.0)
MCV: 98.4 fL (ref 78.0–100.0)
Platelets: 331 10*3/uL (ref 150–400)
RBC: 2.51 MIL/uL — ABNORMAL LOW (ref 3.87–5.11)
RDW: 15.5 % (ref 11.5–15.5)
WBC: 14 10*3/uL — AB (ref 4.0–10.5)

## 2015-01-11 LAB — GLUCOSE, CAPILLARY
GLUCOSE-CAPILLARY: 101 mg/dL — AB (ref 70–99)
Glucose-Capillary: 125 mg/dL — ABNORMAL HIGH (ref 70–99)
Glucose-Capillary: 164 mg/dL — ABNORMAL HIGH (ref 70–99)
Glucose-Capillary: 214 mg/dL — ABNORMAL HIGH (ref 70–99)

## 2015-01-11 MED ORDER — ONDANSETRON HCL 4 MG PO TABS: 4.0000 mg | ORAL_TABLET | Freq: Three times a day (TID) | ORAL | Status: AC

## 2015-01-11 MED ORDER — GABAPENTIN 300 MG PO CAPS: 300.0000 mg | ORAL_CAPSULE | Freq: Two times a day (BID) | ORAL | Status: AC

## 2015-01-11 MED ORDER — ENSURE HIGH PROTEIN PO LIQD: 237.0000 mL | Freq: Two times a day (BID) | ORAL | Status: DC

## 2015-01-11 MED ORDER — GABAPENTIN 300 MG PO CAPS
300.0000 mg | ORAL_CAPSULE | Freq: Two times a day (BID) | ORAL | Status: DC
  Administered 2015-01-11: 300 mg via ORAL
  Filled 2015-01-11 (×2): qty 1

## 2015-01-11 MED ORDER — AMLODIPINE BESYLATE 10 MG PO TABS
10.0000 mg | ORAL_TABLET | Freq: Every day | ORAL | Status: DC
  Administered 2015-01-11: 10 mg via ORAL
  Filled 2015-01-11: qty 1

## 2015-01-11 MED ORDER — AMOXICILLIN-POT CLAVULANATE 400-57 MG/5ML PO SUSR: 500.0000 mg | Freq: Two times a day (BID) | ORAL | Status: AC

## 2015-01-11 MED ORDER — GABAPENTIN 600 MG PO TABS
300.0000 mg | ORAL_TABLET | Freq: Two times a day (BID) | ORAL | Status: DC
  Filled 2015-01-11 (×2): qty 0.5

## 2015-01-11 MED ORDER — GLUCERNA SHAKE PO LIQD: 237.0000 mL | Freq: Two times a day (BID) | ORAL | Status: AC

## 2015-01-11 MED ORDER — ESOMEPRAZOLE MAGNESIUM 40 MG PO CPDR: 40.0000 mg | DELAYED_RELEASE_CAPSULE | Freq: Two times a day (BID) | ORAL | Status: AC

## 2015-01-11 NOTE — Progress Notes (Signed)
Patient ID: Kristi Fuentes, female   DOB: 03/09/1946, 69 y.o.   MRN: [id number] Followed up on potential discharge with home hospice. Called SW on call for weekend and updated her on referral. She is aware. Per GI note pt is ready for DC home.  Thank you for consulting Palliative Medicine Romona Curls, ANP

## 2015-01-11 NOTE — Discharge Summary (Signed)
Physician Discharge Summary  Kristi Fuentes WUJ:[id number] DOB: 1946-04-12 DOA: 01/08/2015  PCP: Dwan Bolt, MD  Admit date: 01/08/2015 Discharge date: 01/11/2015  Time spent: 50 minutes  Recommendations for Outpatient Follow-up:  1. GI to call patient with biopsy results  Discharge Condition: stable Diet recommendation: full liquid diet for 5 days after which she can switch to a soft diet  Discharge Diagnoses:  Principal Problem:   Nausea & vomiting Active Problems:   HTN (hypertension)   Chest pain/ Epigastric pain   Leukocytosis   Stage 3 chronic kidney disease   Pancreatic cancer metastasized to liver   History of present illness:  Kristi Fuentes is a 69 y.o. 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 CT of the abdomen pelvis reveals a markedly distended gastric fundus and body with a possibility of gastric outlet obstruction in the antrum or the pylorus. Of note she was admitted recently after a suicide attempt with an aspirin overdose. She was diagnosed with an aspiration pneumonia at the time and discharged home on Augmentin.  Hospital Course:  Principal Problem:  Nausea & vomiting/ Chest pain/ Epigastric pain - vomiting resolved after being admitted to the hospital- chest and epigastric pain also slowly improved and now completely resolved- suspected to be due to below mentioned GI findings - CT of abdomen/pelvis mentioned above- GI consulted due to these findings -EGD performed on 5/6 reveals severe esophagitis and narrowing of the duodenum in a circumferential manner-biopsies obtained-Dr. 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 ) and is status post partial pancreatectomy - Now diagnosed with adenocarcinoma with metastasis in the liver- patient has opted in March to forgo chemotherapy - she has asked for a palliative care eval which I have requested - hospice has been set up for her at home for now -After a detailed conversation with her son, it appears that the patient will be transitioned to hospice home at a point where it is determined that she is actively dying   HTN (hypertension) -Continue atenolol and Norvasc  Diabetic neuropathy -Dosage of gabapentin adjusted based on renal function from 300 mg 4 times a day to 300 mg twice a day   Leukocytosis- recent admission for aspiration pneumonia -Although, Repeat chest x-ray on admission was negative for an infiltrate she may have reaspirated during episodes of vomiting prior to admission - Transitioned from Unasyn to liquid Augmentin- recommend 4 more days to complete the course -Blood cultures obtained and negative thus far - WBC count still elevated at 14 today-She is asymptomatic as far as cough and shortness of breath, she is not hypoxic-no crackles noted on exam to suggest ongoing pneumonia  Hypothyroidism --Continue Synthroid  Diabetes mellitus -Continue Lantus and NovoLog at home  Stage 3 chronic kidney disease -Baseline appears to be anywhere between 2 and 3.6 therefore currently at baseline   Procedures:  EGD  Consultations:  GI  Discharge Exam: Filed Weights   01/08/15 1945 01/09/15 0051  Weight: 71.396 kg (157 lb 6.4 oz) 70.353 kg (155 lb 1.6 oz)   Filed  Vitals:   01/11/15 0426  BP: 146/62  Pulse: 66  Temp: 98.8 F (37.1 C)  Resp: 16    General: AAO x 3, no distress Cardiovascular: RRR, no murmurs  Respiratory: clear to auscultation bilaterally GI: soft, non-tender, non-distended, bowel sound positive  Discharge Instructions You were cared for by a hospitalist during your hospital stay. If you have any questions about your discharge medications or the care you received while you were in the hospital after you are discharged, you can call the unit and asked to speak with the hospitalist on call if the hospitalist that took care of you is not available. Once you are discharged, your primary care physician will handle any further medical issues. Please note that NO REFILLS for any discharge medications will be authorized once you are discharged, as it is imperative that you return to your primary care physician (or establish a relationship with a primary care physician if you do not have one) for your aftercare needs so that they can reassess your need for medications and monitor your lab values.     Medication List    STOP taking these medications        amoxicillin-clavulanate 875-125 MG per tablet  Commonly known as:  AUGMENTIN  Replaced by:  amoxicillin-clavulanate 400-57 MG/5ML suspension      TAKE these medications        amLODipine 10 MG tablet  Commonly known as:  NORVASC  Take 10 mg by mouth daily.     amoxicillin-clavulanate 400-57 MG/5ML suspension  Commonly known as:  AUGMENTIN  Take 6.3 mLs (500 mg total) by mouth every 12 (twelve) hours.     atenolol 25 MG tablet  Commonly known as:  TENORMIN  Take 12.5 mg by mouth daily.     calcium carbonate 750 MG chewable tablet  Commonly known as:  TUMS EX  Chew 1 tablet by mouth 2 (two) times daily.     esomeprazole 40 MG capsule  Commonly known as:  NEXIUM  Take 1 capsule (40 mg total) by mouth 2 (two) times daily before a meal.     feeding supplement (GLUCERNA  SHAKE) Liqd  Take 237 mLs by mouth 2 (two) times daily between meals.     gabapentin 300 MG capsule  Commonly known as:  NEURONTIN  Take 1 capsule (300 mg total) by mouth 2 (two) times daily.     insulin aspart 100 UNIT/ML injection  Commonly known as:  novoLOG  Inject 3 Units into the skin 2 (two) times daily. Takes before breakfast and lunch.     insulin glargine 100 UNIT/ML injection  Commonly known as:  LANTUS  Inject 22 Units into the skin daily before supper.     KLONOPIN 0.5 MG tablet  Generic drug:  clonazePAM  Take 0.5 mg by mouth as needed for anxiety.     lamoTRIgine 200 MG tablet  Commonly known as:  LAMICTAL  Take 200 mg by mouth daily.     levothyroxine 150 MCG tablet  Commonly known as:  SYNTHROID, LEVOTHROID  Take 150 mcg by mouth daily.     loratadine 10 MG tablet  Commonly known as:  CLARITIN  Take 10 mg by mouth daily.     ondansetron 4 MG tablet  Commonly known as:  ZOFRAN  Take  1 tablet (4 mg total) by mouth every 8 (eight) hours.     pravastatin 80 MG tablet  Commonly known as:  PRAVACHOL  Take 80 mg by mouth daily.     tamoxifen 20 MG tablet  Commonly known as:  NOLVADEX  Take 1 tablet (20 mg total) by mouth daily.     zolpidem 5 MG tablet  Commonly known as:  AMBIEN  Take 1 tablet (5 mg total) by mouth at bedtime as needed for sleep.       Allergies  Allergen Reactions  . Metoclopramide Hcl     Tardive dyskinesia-type symptoms  . Ramipril Cough      The results of significant diagnostics from this hospitalization (including imaging, microbiology, ancillary and laboratory) are listed below for reference.    Significant Diagnostic Studies: Ct Abdomen Pelvis Wo Contrast  01/09/2015   CLINICAL DATA:  Pancreatic carcinoma, metastatic to the liver. Worsening epigastric pain with nausea and vomiting.  EXAM: CT ABDOMEN AND PELVIS WITHOUT CONTRAST  TECHNIQUE: Multidetector CT imaging of the abdomen and pelvis was performed following the  standard protocol without IV contrast.  COMPARISON:  None.  FINDINGS: There is marked distention of the gastric fundus and body. The antrum and pylorus are nondistended and mildly irregular. The abrupt transition to narrowed antrum from prominently distended fundus and body is concerning for the possibility of neoplastic restriction of the gastric antrum. There is a large volume of retained food in the stomach, in addition to the oral contrast. The duodenum appears grossly normal. Remainder of the small bowel is unremarkable. Colon is unremarkable. There is no extraluminal air.  Multiple hepatic masses are present, consistent with the described history of metastatic pancreas carcinoma. No bile duct dilatation is evident.  There is no ascites.  There are numerous small peripheral cysts about both kidneys. There is no hydronephrosis or ureteral dilatation. Urinary bladder appears unremarkable.  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  There is a small right pleural effusion and a trace left pleural effusion. There is mild dependent base airspace opacity in the right lung which may be atelectatic.  IMPRESSION: 1. Marked distention of the gastric fundus and body, raising the question of a gastric outlet obstruction in the antrum or pylorus. There is a large volume of retained food in the stomach. 2. Multiple hepatic masses consistent with the described history. 3. Small right pleural effusion. Trace left pleural effusion. Mild atelectatic -appearing right lung base opacities.   Electronically Signed   By: Andreas Newport M.D.   On: 01/09/2015 01:53   Dg Chest 2 View  01/08/2015   CLINICAL DATA:  Chest pain, shortness of breath  EXAM: CHEST  2 VIEW  COMPARISON:  01/01/2015  FINDINGS: Small right pleural effusion. No left pleural effusion. No focal consolidation or pneumothorax. Stable heart and mediastinum. Calcified mediastinal lymph node. No acute osseous abnormality.  IMPRESSION: Small right  pleural effusion.   Electronically Signed   By: Kathreen Devoid   On: 01/08/2015 20:56   Ct Head Wo Contrast  12/30/2014   CLINICAL DATA:  Fall. Hematoma over the right eye. Lethargy. Pancreatic cancer. Possible suicide attempt.  EXAM: CT HEAD WITHOUT CONTRAST  CT CERVICAL SPINE WITHOUT CONTRAST  TECHNIQUE: Multidetector CT imaging of the head and cervical spine was performed following the standard protocol without intravenous contrast. Multiplanar CT image reconstructions of the cervical spine were also generated.  COMPARISON:  CT head and cervical spine 08/07/2009  FINDINGS: CT HEAD FINDINGS  Mild  atrophy is within normal limits for age. No acute infarct, hemorrhage, or mass lesion is present.  A right periorbital hematoma and soft tissue swelling is present. There is no underlying fracture. The paranasal sinuses and mastoid air cells are clear. The calvarium is intact.  CT CERVICAL SPINE FINDINGS  Acquired fusion across the disc space and posterior elements at C3-4 is stable. Degenerative changes are present throughout the cervical spine. Grade 1 anterolisthesis at C6-7 is stable. No acute fracture or traumatic subluxation is evident.  The soft tissues of the neck are unremarkable. The lung apices are clear.  IMPRESSION: 1. Right periorbital hematoma and soft tissue swelling without an underlying fracture. 2. Normal CT appearance of the brain. 3. Mild multilevel degenerative changes in the cervical spine are similar to the prior study. 4. Acquired fusion at C3-4 is stable. 5. No acute fracture or traumatic subluxation within the cervical spine.   Electronically Signed   By: San Morelle M.D.   On: 12/30/2014 11:51   Ct Cervical Spine Wo Contrast  12/30/2014   CLINICAL DATA:  Fall. Hematoma over the right eye. Lethargy. Pancreatic cancer. Possible suicide attempt.  EXAM: CT HEAD WITHOUT CONTRAST  CT CERVICAL SPINE WITHOUT CONTRAST  TECHNIQUE: Multidetector CT imaging of the head and cervical spine was  performed following the standard protocol without intravenous contrast. Multiplanar CT image reconstructions of the cervical spine were also generated.  COMPARISON:  CT head and cervical spine 08/07/2009  FINDINGS: CT HEAD FINDINGS  Mild atrophy is within normal limits for age. No acute infarct, hemorrhage, or mass lesion is present.  A right periorbital hematoma and soft tissue swelling is present. There is no underlying fracture. The paranasal sinuses and mastoid air cells are clear. The calvarium is intact.  CT CERVICAL SPINE FINDINGS  Acquired fusion across the disc space and posterior elements at C3-4 is stable. Degenerative changes are present throughout the cervical spine. Grade 1 anterolisthesis at C6-7 is stable. No acute fracture or traumatic subluxation is evident.  The soft tissues of the neck are unremarkable. The lung apices are clear.  IMPRESSION: 1. Right periorbital hematoma and soft tissue swelling without an underlying fracture. 2. Normal CT appearance of the brain. 3. Mild multilevel degenerative changes in the cervical spine are similar to the prior study. 4. Acquired fusion at C3-4 is stable. 5. No acute fracture or traumatic subluxation within the cervical spine.   Electronically Signed   By: San Morelle M.D.   On: 12/30/2014 11:51   Dg Chest Port 1 View  01/01/2015   CLINICAL DATA:  Aspiration pneumonia.  EXAM: PORTABLE CHEST - 1 VIEW  COMPARISON:  12/31/2014, 12/30/2014, and 05/23/2013  FINDINGS: Endotracheal tube and NG tube have been removed.  Small focal area of infiltrate/ atelectasis at the left base. Minimal atelectasis at the right base. Chronic elevation of the left hemidiaphragm, unchanged since 05/23/2013.  Heart size and pulmonary vascularity are normal. Calcified lymph node adjacent to the carina.  IMPRESSION: Small infiltrate/atelectasis at the left lung base, slightly increased.   Electronically Signed   By: Lorriane Shire M.D.   On: 01/01/2015 08:28   Dg Chest  Port 1 View  12/31/2014   CLINICAL DATA:  Aspirin overdose.  EXAM: PORTABLE CHEST - 1 VIEW  COMPARISON:  12/30/2014.  FINDINGS: Endotracheal tube in stable position. Interim placement of NG tube. NG tube tip is projected over the lower chest, most likely in the distal esophagus. Further advancement suggested. Calcified right hilar lymph node. Low  lung volumes with basilar atelectasis. Mild cardiomegaly pulmonary vascular prominence. Stable elevation left hemidiaphragm. No pneumothorax. No acute bony abnormality.  IMPRESSION: 1. Interim placement of NG tube. Its tip is in the lower chest, presumably in the lower esophagus. Further advanced min is suggested. Endotracheal tube in stable position. 2. Mild cardiomegaly with mild pulmonary vascular prominence. 3. Low lung volumes with basilar atelectasis . Critical Value/emergent results were called by telephone at the time of interpretation on 12/31/2014 at 7:19 am to nurse Olean Ree , who verbally acknowledged these results.   Electronically Signed   By: Marcello Moores  Register   On: 12/31/2014 07:22   Portable Chest Xray  12/30/2014   CLINICAL DATA:  ET tube placement  EXAM: PORTABLE CHEST - 1 VIEW  COMPARISON:  None.  FINDINGS: There is elevation of the left diaphragm. There is no focal parenchymal opacity. There is no pleural effusion or pneumothorax.  Normal cardiomediastinal silhouette.  No acute osseus abnormality.  Endotracheal tube with the tip 4.5 cm above the carina.  IMPRESSION: Endotracheal tube with the tip 4.5 cm above the carina.   Electronically Signed   By: Kathreen Devoid   On: 12/30/2014 18:00   Dg Abd Portable 1v  12/31/2014   CLINICAL DATA:  Nasogastric tube placement  EXAM: PORTABLE ABDOMEN - 1 VIEW  COMPARISON:  December 30, 2014  FINDINGS: Nasogastric tube tip is at the gastroesophageal junction. Bowel gas pattern unremarkable. There is atelectasis in the left base region.  IMPRESSION: Nasogastric tube tip is at the gastroesophageal junction. Advise  advancing nasogastric tube 8 to 10 cm to insure placement of the tube tip and side-port in the stomach. Bowel gas pattern unremarkable.  These results will be called to the ordering clinician or representative by the Radiologist Assistant, and communication documented in the PACS or zVision Dashboard.   Electronically Signed   By: Lowella Grip III M.D.   On: 12/31/2014 09:48   Dg Abd Portable 1v  12/31/2014   CLINICAL DATA:  Orogastric tube placement.  Initial encounter.  EXAM: PORTABLE ABDOMEN - 1 VIEW  COMPARISON:  Abdominal radiograph performed 10/16/2011  FINDINGS: The patient's enteric tube is noted ending overlying the distal esophagus. This should be advanced at least 10 cm.  The visualized bowel gas pattern is grossly unremarkable. Scattered air-filled loops of small and large bowel are seen, without evidence of bowel obstruction.  Mild degenerative change is noted along the lumbar spine. No acute osseous abnormalities are seen.  IMPRESSION: Enteric tube noted ending overlying the distal esophagus. This should be advanced at least 10 cm, as deemed clinically appropriate.   Electronically Signed   By: Garald Balding M.D.   On: 12/31/2014 02:09    Microbiology: Recent Results (from the past 240 hour(s))  Blood culture (routine x 2)     Status: None (Preliminary result)   Collection Time: 01/08/15 10:30 PM  Result Value Ref Range Status   Specimen Description BLOOD LEFT ARM  Final   Special Requests BOTTLES DRAWN AEROBIC AND ANAEROBIC 10CC  Final   Culture   Final           BLOOD CULTURE RECEIVED NO GROWTH TO DATE CULTURE WILL BE HELD FOR 5 DAYS BEFORE ISSUING A FINAL NEGATIVE REPORT Performed at [facility]    Report Status PENDING  Incomplete  Blood culture (routine x 2)     Status: None (Preliminary result)   Collection Time: 01/08/15 10:42 PM  Result Value Ref Range Status  Specimen Description BLOOD LEFT HAND  Final   Special Requests BOTTLES DRAWN AEROBIC ONLY 4CC   Final   Culture   Final           BLOOD CULTURE RECEIVED NO GROWTH TO DATE CULTURE WILL BE HELD FOR 5 DAYS BEFORE ISSUING A FINAL NEGATIVE REPORT Performed at [facility]    Report Status PENDING  Incomplete     Labs: Basic Metabolic Panel:  Recent Labs Lab 01/08/15 1952 01/09/15 0334 01/10/15 0532  NA 144 142 142  K 4.7 5.0 4.5  CL 104 104 109  CO2 27 26 25   GLUCOSE 270* 371* 102*  BUN 38* 36* 29*  CREATININE 2.83* 2.76* 2.74*  CALCIUM 8.1* 7.7* 7.0*   Liver Function Tests:  Recent Labs Lab 01/08/15 1952  AST 24  ALT 27  ALKPHOS 66  BILITOT 0.5  PROT 6.0*  ALBUMIN 3.2*    Recent Labs Lab 01/08/15 2000  LIPASE 24   No results for input(s): AMMONIA in the last 168 hours. CBC:  Recent Labs Lab 01/08/15 1952 01/09/15 0334 01/10/15 0532 01/11/15 0422  WBC 19.9* 18.3* 13.1* 14.0*  NEUTROABS 15.8*  --   --   --   HGB 9.3* 8.3* 8.3* 8.0*  HCT 29.6* 25.3* 26.3* 24.7*  MCV 99.7 99.6 101.5* 98.4  PLT 327 300 287 331   Cardiac Enzymes:  Recent Labs Lab 01/08/15 2000  TROPONINI 0.03   BNP: BNP (last 3 results) No results for input(s): BNP in the last 8760 hours.  ProBNP (last 3 results) No results for input(s): PROBNP in the last 8760 hours.  CBG:  Recent Labs Lab 01/10/15 1614 01/10/15 2026 01/11/15 0001 01/11/15 0412 01/11/15 0752  GLUCAP 306* 224* 164* 101* 125*       SignedDebbe Odea, MD [facility] 01/11/2015, 10:25 AM

## 2015-01-11 NOTE — Progress Notes (Signed)
[facility] Progress Note    Since last GI note: Is tolerating full liquid diet very well.  Objective: Vital signs in last 24 hours: Temp:  [98.8 F (37.1 C)-99.8 F (37.7 C)] 98.8 F (37.1 C) (05/08 0426) Pulse Rate:  [66-67] 66 (05/08 0426) Resp:  [16-18] 16 (05/08 0426) BP: (139-146)/(59-75) 146/62 mmHg (05/08 0426) SpO2:  [94 %-96 %] 94 % (05/08 0426) Last BM Date: 01/10/15 General: alert and oriented times 3 Heart: regular rate and rythm Abdomen: soft, non-tender, non-distended, normal bowel sounds   Lab Results:  Recent Labs  01/09/15 0334 01/10/15 0532 01/11/15 0422  WBC 18.3* 13.1* 14.0*  HGB 8.3* 8.3* 8.0*  PLT 300 287 331  MCV 99.6 101.5* 98.4    Recent Labs  01/08/15 1952 01/09/15 0334 01/10/15 0532  NA 144 142 142  K 4.7 5.0 4.5  CL 104 104 109  CO2 27 26 25   GLUCOSE 270* 371* 102*  BUN 38* 36* 29*  CREATININE 2.83* 2.76* 2.74*  CALCIUM 8.1* 7.7* 7.0*    Recent Labs  01/08/15 1952  PROT 6.0*  ALBUMIN 3.2*  AST 24  ALT 27  ALKPHOS 66  BILITOT 0.5   No results for input(s): INR in the last 72 hours.   Studies/Results: No results found.   Medications: Scheduled Meds: . amoxicillin-clavulanate  500 mg Oral Q12H  . atenolol  12.5 mg Oral Daily  . gabapentin  600 mg Oral QHS  . heparin subcutaneous  5,000 Units Subcutaneous 3 times per day  . insulin aspart  0-9 Units Subcutaneous 6 times per day  . insulin glargine  15 Units Subcutaneous QHS  . lamoTRIgine  200 mg Oral Daily  . ondansetron  4 mg Intravenous BID  . pantoprazole (PROTONIX) IV  40 mg Intravenous Q12H  . tamoxifen  20 mg Oral Daily   Continuous Infusions:  PRN Meds:.clonazePAM, ondansetron (ZOFRAN) IV, promethazine, zolpidem    Assessment/Plan: 69 y.o. female with improved vomiting from partial GOO (benign vs. Malignant abnormal distal stomach?)  OK to go home today.  She should stay on PPI twice daily indefinitely.  Should remain on scheduled  daily zofran as well (4mg  po).   She should remain on full liquids at least another 4-5 days before trying soft foods.  I will contact her with biopsy results.  Please call or page with any further questions or concerns.   Milus Banister, MD  01/11/2015, 8:14 AM [facility] Pager [phone]

## 2015-01-11 NOTE — Care Management Note (Addendum)
Case Management Note  Patient Details  Name: Kristi Fuentes MRN: [id number] Date of Birth: 1946-08-25  Subjective/Objective:                   Pancreatic CA w liver mets Action/Plan:  Discharge planning Expected Discharge Date:                  Expected Discharge Plan:  Home w Hospice Care  In-House Referral:     Discharge planning Services  CM Consult  Post Acute Care Choice:  Hospice Choice offered to:  Patient  DME Arranged:    DME Agency:     HH Arranged:    [place]  Status of Service:  In process, will continue to follow  Medicare Important Message Given:    Date Medicare IM Given:    Medicare IM give by:    Date Additional Medicare IM Given:    Additional Medicare Important Message give by:     If discussed at [place] of Stay Meetings, dates discussed:    Additional Comments: 14:15 CM received callback from [facility] rep, susan who requests I fax facesheet, H&P, progress notes and Hospice order/consult to [phone].  Cm faxed aforementioned.  [name] states she will call pt in the room and arrange for admission tomorrow in the morning.  No other CM needs were communicated.   11:20 CM met with pt who chooses [facility]).  Pt was active with [facility] and states she had an in-home hospice consult but is not sure if MD was from [place].  Pt states she does not need any DME at this time as she wants to be in her own bed, has a 3n1 (for over her commode), does not want a bedside table, does not want a wheelchair or walker at this time.  Pt is looking forward to meeting with Dr. Hilma Favors this evening.  Pt verbalizes understanding [facility] does not provide day to day care and she was given a [facility] [employer] for future needs.  Pt verbalizes understanding Private Duty List is not covered by insurance and is aware of the approx. $25-30/hour cost.  Pt has a 28 year old husband at home; physician son who lives in [place].  Pt  states she does not need daily care "quite yet."  Will continue to follow for disposition. Dellie Catholic, RN 01/11/2015, 11:22 AM

## 2015-01-11 NOTE — Discharge Instructions (Signed)
Full liquids for 4-5 days and then soft food. Please discuss with Dr. Ardis Hughs about when to change Zofran from 3 times a day every day to as needed.

## 2015-01-12 ENCOUNTER — Encounter (HOSPITAL_COMMUNITY): Payer: Self-pay | Admitting: Gastroenterology

## 2015-01-15 LAB — CULTURE, BLOOD (ROUTINE X 2)
Culture: NO GROWTH
Culture: NO GROWTH

## 2015-01-15 NOTE — ED Provider Notes (Signed)
CSN: [id number]     Arrival date & time 01/08/15  1940 History   First MD Initiated Contact with Patient 01/08/15 2000     Chief Complaint  Patient presents with  . Emesis     (Consider location/radiation/quality/duration/timing/severity/associated sxs/prior Treatment) Patient is a 69 y.o. female presenting with vomiting.  Emesis Severity:  Moderate Duration:  2 days Timing:  Intermittent Emesis appearance: initially stomach contents, now pink. Progression:  Worsening Chronicity:  New Context comment:  Recent admission for salicylate overdose, required intubation. Has history of metastatic pancreatic cancer. Relieved by:  Nothing Exacerbated by: eating and drinking. Associated symptoms: no abdominal pain, no chills, no diarrhea and no fever   Associated symptoms comment:  Chest pain when eating something.   Past Medical History  Diagnosis Date  . Diabetes mellitus   . Hypertension   . Chronic kidney disease   . Kidney stones   . Allergy   . Pancreatic cancer     Chemotherapy  . Primary pancreatic neuroendocrine tumor 2013  . Blood transfusion without reported diagnosis   . Heart murmur   . Thyroid disease 01/2002    2 radiaoactive iodine for cancer  . PONV (postoperative nausea and vomiting)   . Hypothyroidism     had thyroid removed 2002  . Cyst (solitary) of breast   . Simple cyst of kidney     unsure of which side maybe on both kidney's  . Incisional hernia   . Anemia   . Depression     takes Lamictal daily  . Status post chemotherapy     pancreatic histrory  . Kidney failure     During chemotherapy for pancreatic cancer  . Breast cancer   . Sarcoma     Right buttock  . Hernia     Abdominal   Past Surgical History  Procedure Laterality Date  . Buttock sarcoma  2000  . 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 Abdominal hysterectomy    . Total knee arthroplasty      left  . Lipoma excision  04/20/2011    left arm  .  Cholecystectomy    . Spleenectomy    . Breast surgery    . Urethral stricture dilatation  [id number]  . Pressure ulcer buttock  2005    from position during knee surgery   . Breast lumpectomy with needle localization and axillary sentinel lymph node bx Right 05/30/2013    Procedure: BREAST LUMPECTOMY WITH NEEDLE LOCALIZATION AND AXILLARY SENTINEL LYMPH NODE BX;  Surgeon: Harl Bowie, MD;  Location: [facility];  Service: General;  Laterality: Right;  . Esophagogastroduodenoscopy N/A 01/09/2015    Procedure: ESOPHAGOGASTRODUODENOSCOPY (EGD);  Surgeon: Milus Banister, MD;  Location: [facility];  Service: Endoscopy;  Laterality: N/A;   Family History  Problem Relation Age of Onset  . Diabetes Father   . Kidney disease Father   . Cancer Father     colon  . Cancer Maternal Grandmother     breast   History  Substance Use Topics  . Smoking status: Never Smoker   . Smokeless tobacco: Never Used     Comment: never used tobacco  . Alcohol Use: No   OB History    Gravida Para Term Preterm AB TAB SAB Ectopic Multiple Living   1 1              Obstetric Comments   Menarche age 68, parity age 39, G6, P85, adopted daughters, BC x  3-4 years, No HRT     Review of Systems  Constitutional: Negative for chills.  Gastrointestinal: Positive for vomiting. Negative for abdominal pain and diarrhea.  All other systems reviewed and are negative.     Allergies  Metoclopramide hcl and Ramipril  Home Medications   Prior to Admission medications   Medication Sig Start Date End Date Taking? Authorizing Provider  amLODipine (NORVASC) 10 MG tablet Take 10 mg by mouth daily.   Yes Historical Provider, MD  atenolol (TENORMIN) 25 MG tablet Take 12.5 mg by mouth daily.    Yes Historical Provider, MD  calcium carbonate (TUMS EX) 750 MG chewable tablet Chew 1 tablet by mouth 2 (two) times daily.   Yes Historical Provider, MD  clonazePAM (KLONOPIN) 0.5 MG tablet Take 0.5 mg by mouth as needed for anxiety.    Yes Historical Provider, MD  insulin aspart (NOVOLOG) 100 UNIT/ML injection Inject 3 Units into the skin 2 (two) times daily. Takes before breakfast and lunch.   Yes Historical Provider, MD  insulin glargine (LANTUS) 100 UNIT/ML injection Inject 22 Units into the skin daily before supper.   Yes Historical Provider, MD  lamoTRIgine (LAMICTAL) 200 MG tablet Take 200 mg by mouth daily.    Yes Historical Provider, MD  levothyroxine (SYNTHROID, LEVOTHROID) 150 MCG tablet Take 150 mcg by mouth daily.     Yes Historical Provider, MD  loratadine (CLARITIN) 10 MG tablet Take 10 mg by mouth daily.     Yes Historical Provider, MD  pravastatin (PRAVACHOL) 80 MG tablet Take 80 mg by mouth daily.     Yes Historical Provider, MD  tamoxifen (NOLVADEX) 20 MG tablet Take 1 tablet (20 mg total) by mouth daily. 07/09/14  Yes Volanda Napoleon, MD  zolpidem (AMBIEN) 5 MG tablet Take 1 tablet (5 mg total) by mouth at bedtime as needed for sleep. 01/05/15  Yes Delfina Redwood, MD  amoxicillin-clavulanate (AUGMENTIN) 400-57 MG/5ML suspension Take 6.3 mLs (500 mg total) by mouth every 12 (twelve) hours. 01/11/15   Debbe Odea, MD  esomeprazole (NEXIUM) 40 MG capsule Take 1 capsule (40 mg total) by mouth 2 (two) times daily before a meal. 01/11/15   Debbe Odea, MD  feeding supplement, GLUCERNA SHAKE, (GLUCERNA SHAKE) LIQD Take 237 mLs by mouth 2 (two) times daily between meals. 01/11/15   Debbe Odea, MD  gabapentin (NEURONTIN) 300 MG capsule Take 1 capsule (300 mg total) by mouth 2 (two) times daily. 01/11/15   Debbe Odea, MD  ondansetron (ZOFRAN) 4 MG tablet Take 1 tablet (4 mg total) by mouth every 8 (eight) hours. 01/11/15   Debbe Odea, MD   BP 124/64 mmHg  Pulse 62  Temp(Src) 98.8 F (37.1 C) (Oral)  Resp 16  Ht 5\' 2"  (1.575 m)  Wt 155 lb 1.6 oz (70.353 kg)  BMI 28.36 kg/m2  SpO2 98% Physical Exam  Constitutional: She is oriented to person, place, and time. She appears well-developed and well-nourished. No  distress.  HENT:  Head: Normocephalic and atraumatic.  Mouth/Throat: Oropharynx is clear and moist.  Eyes: Conjunctivae are normal. Pupils are equal, round, and reactive to light. No scleral icterus.  Neck: Neck supple.  Cardiovascular: Normal rate, regular rhythm, normal heart sounds and intact distal pulses.   No murmur heard. Pulmonary/Chest: Effort normal and breath sounds normal. No stridor. No respiratory distress. She has no rales.  Abdominal: Soft. Bowel sounds are normal. She exhibits no distension. There is no tenderness. There is no rebound and no guarding.  Genitourinary: Rectal exam shows no mass. Guaiac positive stool (nonbloody, nonmelanic).  Musculoskeletal: Normal range of motion.  Neurological: She is alert and oriented to person, place, and time.  Skin: Skin is warm and dry. No rash noted.  Psychiatric: She has a normal mood and affect. Her behavior is normal.  Nursing note and vitals reviewed.   ED Course  Procedures (including critical care time) Labs Review Labs Reviewed  CBC WITH DIFFERENTIAL/PLATELET - Abnormal; Notable for the following:    WBC 19.9 (*)    RBC 2.97 (*)    Hemoglobin 9.3 (*)    HCT 29.6 (*)    RDW 15.6 (*)    Neutrophils Relative % 80 (*)    Neutro Abs 15.8 (*)    Monocytes Absolute 1.4 (*)    All other components within normal limits  COMPREHENSIVE METABOLIC PANEL - Abnormal; Notable for the following:    Glucose, Bld 270 (*)    BUN 38 (*)    Creatinine, Ser 2.83 (*)    Calcium 8.1 (*)    Total Protein 6.0 (*)    Albumin 3.2 (*)    GFR calc non Af Amer 16 (*)    GFR calc Af Amer 18 (*)    All other components within normal limits  CBC - Abnormal; Notable for the following:    WBC 18.3 (*)    RBC 2.54 (*)    Hemoglobin 8.3 (*)    HCT 25.3 (*)    RDW 15.6 (*)    All other components within normal limits  BASIC METABOLIC PANEL - Abnormal; Notable for the following:    Glucose, Bld 371 (*)    BUN 36 (*)    Creatinine, Ser 2.76  (*)    Calcium 7.7 (*)    GFR calc non Af Amer 16 (*)    GFR calc Af Amer 19 (*)    All other components within normal limits  GLUCOSE, CAPILLARY - Abnormal; Notable for the following:    Glucose-Capillary 340 (*)    All other components within normal limits  GLUCOSE, CAPILLARY - Abnormal; Notable for the following:    Glucose-Capillary 319 (*)    All other components within normal limits  GLUCOSE, CAPILLARY - Abnormal; Notable for the following:    Glucose-Capillary 132 (*)    All other components within normal limits  GLUCOSE, CAPILLARY - Abnormal; Notable for the following:    Glucose-Capillary 109 (*)    All other components within normal limits  BASIC METABOLIC PANEL - Abnormal; Notable for the following:    Glucose, Bld 102 (*)    BUN 29 (*)    Creatinine, Ser 2.74 (*)    Calcium 7.0 (*)    GFR calc non Af Amer 16 (*)    GFR calc Af Amer 19 (*)    All other components within normal limits  CBC - Abnormal; Notable for the following:    WBC 13.1 (*)    RBC 2.59 (*)    Hemoglobin 8.3 (*)    HCT 26.3 (*)    MCV 101.5 (*)    RDW 15.6 (*)    All other components within normal limits  GLUCOSE, CAPILLARY - Abnormal; Notable for the following:    Glucose-Capillary 171 (*)    All other components within normal limits  GLUCOSE, CAPILLARY - Abnormal; Notable for the following:    Glucose-Capillary 155 (*)    All other components within normal limits  GLUCOSE, CAPILLARY - Abnormal; Notable for  the following:    Glucose-Capillary 110 (*)    All other components within normal limits  GLUCOSE, CAPILLARY - Abnormal; Notable for the following:    Glucose-Capillary 102 (*)    All other components within normal limits  GLUCOSE, CAPILLARY - Abnormal; Notable for the following:    Glucose-Capillary 199 (*)    All other components within normal limits  GLUCOSE, CAPILLARY - Abnormal; Notable for the following:    Glucose-Capillary 306 (*)    All other components within normal limits   CBC - Abnormal; Notable for the following:    WBC 14.0 (*)    RBC 2.51 (*)    Hemoglobin 8.0 (*)    HCT 24.7 (*)    All other components within normal limits  GLUCOSE, CAPILLARY - Abnormal; Notable for the following:    Glucose-Capillary 224 (*)    All other components within normal limits  GLUCOSE, CAPILLARY - Abnormal; Notable for the following:    Glucose-Capillary 164 (*)    All other components within normal limits  GLUCOSE, CAPILLARY - Abnormal; Notable for the following:    Glucose-Capillary 101 (*)    All other components within normal limits  GLUCOSE, CAPILLARY - Abnormal; Notable for the following:    Glucose-Capillary 125 (*)    All other components within normal limits  GLUCOSE, CAPILLARY - Abnormal; Notable for the following:    Glucose-Capillary 214 (*)    All other components within normal limits  CULTURE, BLOOD (ROUTINE X 2)  CULTURE, BLOOD (ROUTINE X 2)  TROPONIN I  LIPASE, BLOOD  GLUCOSE, CAPILLARY  GLUCOSE, CAPILLARY  POC OCCULT BLOOD, ED  SURGICAL PATHOLOGY    Imaging Review No results found.   EKG Interpretation   Date/Time:  Thursday Jan 08 2015 20:25:09 EDT Ventricular Rate:  62 PR Interval:  145 QRS Duration: 137 QT Interval:  483 QTC Calculation: 490 R Axis:   -61 Text Interpretation:  Sinus rhythm RBBB and LAFB Probable left ventricular  hypertrophy Anterolateral infarct, age indeterminate nonspecific t wave  abnormality Confirmed by [facility]  MD, TREY ([id number]) on 01/08/2015 10:01:02 PM      MDM   Final diagnoses:  Chest pain  HCAP Vomiting Hematemesis   69 yo female with hx of metastatic pancreatic cancer, recent admission for salicylate OD, who presents with vomiting and chest pain.  Found to have temp of 37.9, right pleural effusion, leukocytosis of 20,000.  Covered for HCAP.  Admitted by internal medicine.      Serita Grit, MD 01/15/15 [phone]

## 2015-01-30 ENCOUNTER — Telehealth: Payer: Self-pay | Admitting: Gastroenterology

## 2015-01-30 NOTE — Telephone Encounter (Signed)
Pt is a Dr Earlean Shawl pt and was advised to call that office for recommendations pt agreed

## 2015-03-02 ENCOUNTER — Other Ambulatory Visit: Payer: Self-pay

## 2015-03-06 DEATH — deceased

## 2015-10-20 IMAGING — CR DG CHEST 1V PORT
1 series · 1 of 1 positions shown · non-contrast
Comparison: 12/31/2014, 12/30/2014, and 05/23/2013

CLINICAL DATA: Aspiration pneumonia.

EXAM:
PORTABLE CHEST - 1 VIEW

[AP]
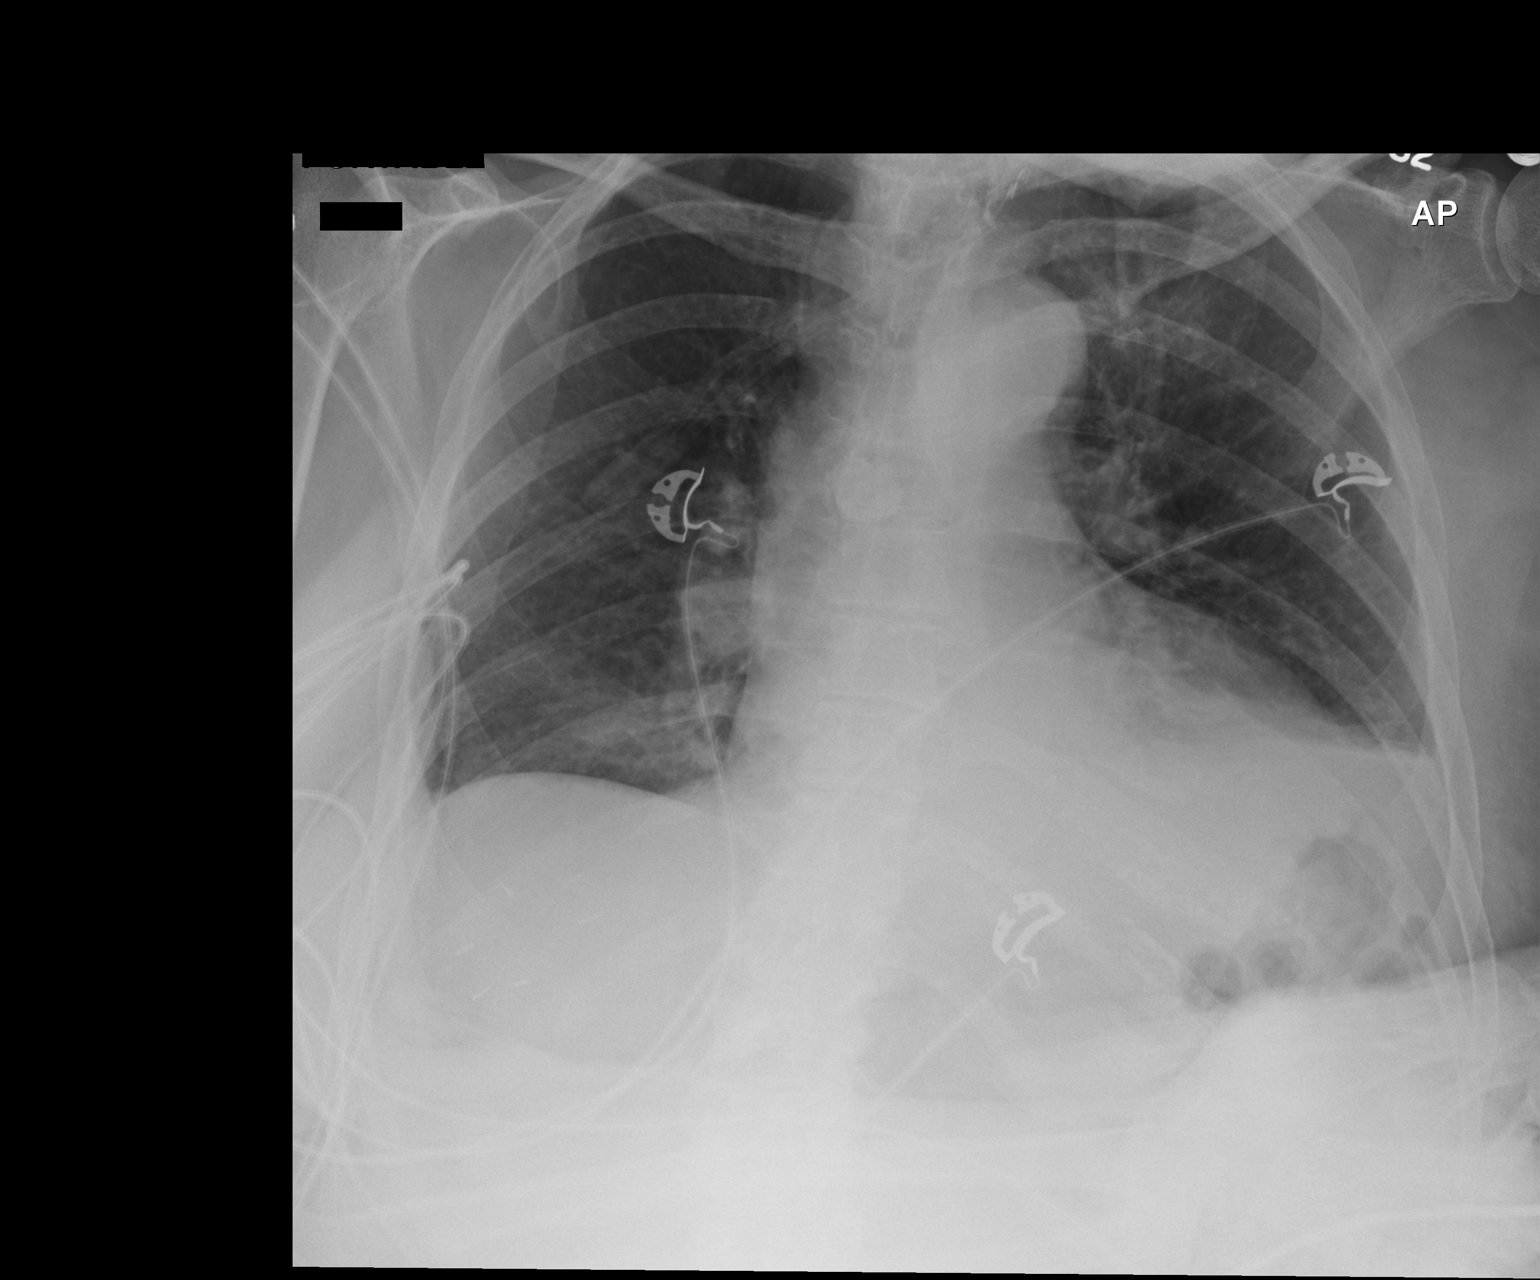

[1 of 1 positions shown; findings below may reference images not displayed]

FINDINGS: Endotracheal tube and NG tube have been removed.

Small focal area of infiltrate/ atelectasis at the left base.
Minimal atelectasis at the right base. Chronic elevation of the left
hemidiaphragm, unchanged since 05/23/2013.

Heart size and pulmonary vascularity are normal. Calcified lymph
node adjacent to the carina.
IMPRESSION: Small infiltrate/atelectasis at the left lung base, slightly
increased.
